# Patient Record
Sex: Female | Born: 2000 | Race: Black or African American | Hispanic: No | Marital: Single | Smoking: Never smoker
Health system: Southern US, Community
[De-identification: ages and names within clinical notes are randomized; demographics above are authoritative.]

## PROBLEM LIST (undated history)

## (undated) DIAGNOSIS — S8990XA Unspecified injury of unspecified lower leg, initial encounter: Secondary | ICD-10-CM

---
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
## 2020-05-23 ENCOUNTER — Emergency Department (HOSPITAL_COMMUNITY)
Admission: EM | Admit: 2020-05-23 | Discharge: 2020-05-24 | Disposition: A | Discharge status: Home / Self Care | Payer: 59 | Attending: Emergency Medicine | Admitting: Emergency Medicine

## 2020-05-23 ENCOUNTER — Emergency Department (HOSPITAL_COMMUNITY): Payer: 59

## 2020-05-23 ENCOUNTER — Other Ambulatory Visit: Payer: Self-pay

## 2020-05-23 ENCOUNTER — Encounter (HOSPITAL_COMMUNITY): Payer: Self-pay

## 2020-05-23 DIAGNOSIS — R1031 Right lower quadrant pain: Secondary | ICD-10-CM | POA: Insufficient documentation

## 2020-05-23 DIAGNOSIS — R102 Pelvic and perineal pain: Secondary | ICD-10-CM

## 2020-05-23 LAB — CBC
HCT: 35.2 % — ABNORMAL LOW (ref 36.0–46.0)
Hemoglobin: 11.1 g/dL — ABNORMAL LOW (ref 12.0–15.0)
MCH: 27.2 pg (ref 26.0–34.0)
MCHC: 31.5 g/dL (ref 30.0–36.0)
MCV: 86.3 fL (ref 80.0–100.0)
Platelets: 312 10*3/uL (ref 150–400)
RBC: 4.08 MIL/uL (ref 3.87–5.11)
RDW: 13.2 % (ref 11.5–15.5)
WBC: 6.5 10*3/uL (ref 4.0–10.5)
nRBC: 0 % (ref 0.0–0.2)

## 2020-05-23 LAB — COMPREHENSIVE METABOLIC PANEL
ALT: 10 U/L (ref 0–44)
AST: 14 U/L — ABNORMAL LOW (ref 15–41)
Albumin: 3.9 g/dL (ref 3.5–5.0)
Alkaline Phosphatase: 57 U/L (ref 38–126)
Anion gap: 8 (ref 5–15)
BUN: 10 mg/dL (ref 6–20)
CO2: 27 mmol/L (ref 22–32)
Calcium: 9.5 mg/dL (ref 8.9–10.3)
Chloride: 105 mmol/L (ref 98–111)
Creatinine, Ser: 0.78 mg/dL (ref 0.44–1.00)
GFR calc Af Amer: >60 mL/min (ref >60)
GFR calc non Af Amer: >60 mL/min (ref >60)
Glucose, Bld: 105 mg/dL — ABNORMAL HIGH (ref 70–99)
Potassium: 3.6 mmol/L (ref 3.5–5.1)
Sodium: 140 mmol/L (ref 135–145)
Total Bilirubin: 1.2 mg/dL (ref 0.3–1.2)
Total Protein: 7.5 g/dL (ref 6.5–8.1)

## 2020-05-23 LAB — URINALYSIS, ROUTINE W REFLEX MICROSCOPIC
Bilirubin Urine: NEGATIVE
Glucose, UA: NEGATIVE mg/dL
Ketones, ur: NEGATIVE mg/dL
Nitrite: NEGATIVE
Protein, ur: 30 mg/dL — AB
Specific Gravity, Urine: 1.028 (ref 1.005–1.030)
pH: 6 (ref 5.0–8.0)

## 2020-05-23 LAB — I-STAT BETA HCG BLOOD, ED (MC, WL, AP ONLY): I-stat hCG, quantitative: <5 m[IU]/mL (ref <5)

## 2020-05-23 LAB — LIPASE, BLOOD: Lipase: 25 U/L (ref 11–51)

## 2020-05-23 MED ORDER — ONDANSETRON HCL 4 MG/2ML IJ SOLN
4.0000 mg | Freq: Once | INTRAMUSCULAR | Status: AC
  Administered 2020-05-24: 4 mg via INTRAVENOUS
  Filled 2020-05-23: qty 2

## 2020-05-23 MED ORDER — KETOROLAC TROMETHAMINE 30 MG/ML IJ SOLN
30.0000 mg | Freq: Once | INTRAMUSCULAR | Status: AC
  Administered 2020-05-23: 30 mg via INTRAVENOUS
  Filled 2020-05-23: qty 1

## 2020-05-23 MED ORDER — ONDANSETRON HCL 4 MG/2ML IJ SOLN
4.0000 mg | Freq: Once | INTRAMUSCULAR | Status: AC
  Administered 2020-05-23: 4 mg via INTRAVENOUS
  Filled 2020-05-23: qty 2

## 2020-05-23 MED ORDER — HYDROMORPHONE HCL 1 MG/ML IJ SOLN
0.5000 mg | Freq: Once | INTRAMUSCULAR | Status: AC
  Administered 2020-05-23: 0.5 mg via INTRAVENOUS
  Filled 2020-05-23: qty 1

## 2020-05-23 MED ORDER — HYDROMORPHONE HCL 1 MG/ML IJ SOLN
1.0000 mg | Freq: Once | INTRAMUSCULAR | Status: AC
  Administered 2020-05-23: 1 mg via INTRAVENOUS
  Filled 2020-05-23: qty 1

## 2020-05-23 MED ORDER — SODIUM CHLORIDE 0.9 % IV BOLUS
1000.0000 mL | Freq: Once | INTRAVENOUS | Status: AC
  Administered 2020-05-23: 1000 mL via INTRAVENOUS

## 2020-05-23 MED ORDER — HYDROMORPHONE HCL 1 MG/ML IJ SOLN
1.0000 mg | Freq: Once | INTRAMUSCULAR | Status: AC
  Administered 2020-05-24: 1 mg via INTRAVENOUS
  Filled 2020-05-23: qty 1

## 2020-05-23 MED ORDER — SODIUM CHLORIDE 0.9% FLUSH: 3.0000 mL | Freq: Once | INTRAVENOUS | Status: DC

## 2020-05-23 MED ORDER — SODIUM CHLORIDE 0.9 % IV SOLN
1.0000 g | Freq: Once | INTRAVENOUS | Status: AC
  Administered 2020-05-24: 1 g via INTRAVENOUS
  Filled 2020-05-23: qty 10

## 2020-05-23 MED ORDER — SODIUM CHLORIDE 0.9 % IV SOLN: INTRAVENOUS | Status: DC

## 2020-05-23 MED ORDER — HYDROMORPHONE HCL 1 MG/ML IJ SOLN
2.0000 mg | Freq: Once | INTRAMUSCULAR | Status: AC
  Administered 2020-05-23: 2 mg via INTRAVENOUS
  Filled 2020-05-23: qty 2

## 2020-05-23 NOTE — ED Triage Notes (Signed)
Patient complains of right sided abdominal pain with radiation to back that started this am. Nausea, no emesis, no diarrhea. Pain worse with ambulation

## 2020-05-23 NOTE — Discharge Instructions (Addendum)
Extensive work-up for the right lower quadrant abdominal pain without any definitive findings.  Some question of perhaps a urinary tract infection but not definitive.  We will go ahead and treat with antibiotic.  Take the Keflex as directed for the next 7 days.  Take the Percocet as needed for pain.  Take the Zofran to control nausea and vomiting.  Make an appointment to follow-up with your GYN doctor.

## 2020-05-23 NOTE — ED Notes (Signed)
This RN asked the EDP to order pain medication if able for this pt; awaiting for further orders

## 2020-05-23 NOTE — ED Notes (Signed)
Pt transported to US

## 2020-05-23 NOTE — ED Provider Notes (Signed)
[facility] EMERGENCY DEPARTMENT Provider Note   CSN: [id number] Arrival date & time: 05/23/20  1309     History Chief Complaint  Patient presents with  . Flank Pain    Tricia Knox is a 19 y.o. female.  Patient with the acute onset of right lower quadrant abdominal pain is 11 this morning.  No nausea no vomiting.  She states the pain is severe and it was sudden onset.  There was no prodrome.  Patient did say maybe she thought she saw some blood in her urine.  No prior history of kidney stones no prior history of similar pain.  Patient denies any vaginal discharge.        Past Medical History:  Diagnosis Date  . Knee injury     There are no problems to display for this patient.   History reviewed. No pertinent surgical history.   OB History   No obstetric history on file.     No family history on file.  Social History   Tobacco Use  . Smoking status: Never Smoker  . Smokeless tobacco: Never Used  Vaping Use  . Vaping Use: Never used  Substance Use Topics  . Alcohol use: No  . Drug use: Not on file    Home Medications Prior to Admission medications   Medication Sig Start Date End Date Taking? Authorizing Provider  cephALEXin (KEFLEX) 500 MG capsule Take 1 capsule (500 mg total) by mouth 4 (four) times daily. 05/24/20   Vanetta Mulders, MD  ondansetron (ZOFRAN ODT) 4 MG disintegrating tablet Take 1 tablet (4 mg total) by mouth every 8 (eight) hours as needed for nausea or vomiting. 12/19/16   Bethel Born, PA-C  ondansetron (ZOFRAN ODT) 4 MG disintegrating tablet Take 1 tablet (4 mg total) by mouth every 8 (eight) hours as needed for nausea or vomiting. 05/23/20   Vanetta Mulders, MD  oxyCODONE-acetaminophen (PERCOCET/ROXICET) 5-325 MG tablet Take 1 tablet by mouth every 6 (six) hours as needed for severe pain. 05/24/20   Vanetta Mulders, MD    Allergies    Patient has no known allergies.  Review of Systems   Review of Systems    Constitutional: Negative for chills and fever.  HENT: Negative for congestion, rhinorrhea and sore throat.   Eyes: Negative for visual disturbance.  Respiratory: Negative for cough and shortness of breath.   Cardiovascular: Negative for chest pain and leg swelling.  Gastrointestinal: Positive for abdominal pain. Negative for diarrhea, nausea and vomiting.  Genitourinary: Positive for flank pain and hematuria. Negative for dysuria, vaginal bleeding and vaginal discharge.  Musculoskeletal: Negative for back pain and neck pain.  Skin: Negative for rash.  Neurological: Negative for dizziness, light-headedness and headaches.  Hematological: Does not bruise/bleed easily.  Psychiatric/Behavioral: Negative for confusion.    Physical Exam Updated Vital Signs BP 101/60   Pulse 68   Temp 98.4 F (36.9 C) (Oral)   Resp (!) 26   SpO2 93%   Physical Exam Vitals and nursing note reviewed.  Constitutional:      General: She is in acute distress.     Appearance: Normal appearance. She is well-developed.  HENT:     Head: Normocephalic and atraumatic.  Eyes:     Extraocular Movements: Extraocular movements intact.     Conjunctiva/sclera: Conjunctivae normal.     Pupils: Pupils are equal, round, and reactive to light.  Cardiovascular:     Rate and Rhythm: Normal rate and regular rhythm.  Heart sounds: No murmur heard.   Pulmonary:     Effort: Pulmonary effort is normal. No respiratory distress.     Breath sounds: Normal breath sounds.  Abdominal:     Palpations: Abdomen is soft.     Tenderness: There is no abdominal tenderness. There is no guarding.  Musculoskeletal:        General: No swelling. Normal range of motion.     Cervical back: Normal range of motion and neck supple.  Skin:    General: Skin is warm and dry.     Capillary Refill: Capillary refill takes less than 2 seconds.  Neurological:     General: No focal deficit present.     Mental Status: She is alert and oriented  to person, place, and time.     ED Results / Procedures / Treatments   Labs (all labs ordered are listed, but only abnormal results are displayed) Labs Reviewed  COMPREHENSIVE METABOLIC PANEL - Abnormal; Notable for the following components:      Result Value   Glucose, Bld 105 (*)    AST 14 (*)    All other components within normal limits  CBC - Abnormal; Notable for the following components:   Hemoglobin 11.1 (*)    HCT 35.2 (*)    All other components within normal limits  URINALYSIS, ROUTINE W REFLEX MICROSCOPIC - Abnormal; Notable for the following components:   APPearance HAZY (*)    Hgb urine dipstick MODERATE (*)    Protein, ur 30 (*)    Leukocytes,Ua TRACE (*)    Bacteria, UA RARE (*)    All other components within normal limits  URINE CULTURE  LIPASE, BLOOD  I-STAT BETA HCG BLOOD, ED ([facility], [facility], [facility] ONLY)    EKG None  Radiology CT Renal Stone Study  Result Date: 05/23/2020 CLINICAL DATA:  Right lower quadrant pain.  Flank pain. EXAM: CT ABDOMEN AND PELVIS WITHOUT CONTRAST TECHNIQUE: Multidetector CT imaging of the abdomen and pelvis was performed following the standard protocol without IV contrast. COMPARISON:  None. FINDINGS: Lower chest: No acute abnormality. Hepatobiliary: No focal liver abnormality is seen. No gallstones, gallbladder wall thickening, or biliary dilatation. Pancreas: Unremarkable. No pancreatic ductal dilatation or surrounding inflammatory changes. Spleen: Normal in size without focal abnormality. Adrenals/Urinary Tract: Adrenal glands are unremarkable. Kidneys are normal, without renal calculi, focal lesion, or hydronephrosis. Bladder is unremarkable. Stomach/Bowel: Stomach is within normal limits. Appendix appears normal. No evidence of bowel wall thickening, distention, or inflammatory changes. Vascular/Lymphatic: No significant vascular findings are present. No enlarged abdominal or pelvic lymph nodes. Reproductive: The uterus is normal. Possible  dominant follicle in the right ovary located just superior and anterior to the uterus as seen on axial image 69 and coronal image 28. Possible prominent ovary in the left adnexa as seen on coronal image 46 and axial image 70. Other: No abdominal wall hernia or abnormality. No abdominopelvic ascites. Musculoskeletal: No acute or significant osseous findings. IMPRESSION: 1. No renal or ureteral stones.  No renal obstruction. 2. Possible dominant follicle/cyst in the right ovary, incompletely evaluated on this study. Suggested prominence of the left ovary as well. Recommend pelvic ultrasound for further evaluation of the adnexa. Electronically Signed   By: Gerome Samavid  Williams III M.D   On: 05/23/2020 17:53   US PELVIC COMPLETE W TRANSVAGINAL AND TORSION R/O  Result Date: 05/23/2020 CLINICAL DATA:  Right-sided abdominal pain and pelvic pain x1 day. EXAM: TRANSABDOMINAL AND TRANSVAGINAL ULTRASOUND OF PELVIS DOPPLER ULTRASOUND OF OVARIES TECHNIQUE:  Both transabdominal and transvaginal ultrasound examinations of the pelvis were performed. Transabdominal technique was performed for global imaging of the pelvis including uterus, ovaries, adnexal regions, and pelvic cul-de-sac. It was necessary to proceed with endovaginal exam following the transabdominal exam to visualize the . Color and duplex Doppler ultrasound was utilized to evaluate blood flow to the ovaries. COMPARISON:  None. FINDINGS: Uterus Measurements: 8.9 cm x 4.5 cm x 4.7 cm = volume: 97.05 mL. No fibroids or other mass visualized. Endometrium Thickness: 2.2 mm. Diffusely increased vascularity is seen throughout the endometrium. Right ovary Measurements: 3.6 cm x 2.8 cm x 3.6 cm = volume: 19.06 mL. A 2.1 cm x 2.1 cm x 1.7 cm anechoic structure is seen within the right ovary. A 4.33 cm x 0.73 cm area of heterogeneous hypoechogenicity is seen superior to the uterus and lateral to the right ovary. Left ovary Measurements: 5.4 cm x 3.5 cm x 4.9 cm = volume: 47.05  mL. A complex appearing area of heterogeneous hypoechogenicity is seen within the left ovary (ultrasound a graphic measurements not provided). Increased flow is seen surrounding this region on color Doppler evaluation. Pulsed Doppler evaluation of both ovaries demonstrates normal low-resistance arterial and venous waveforms. Other findings A small amount of pelvic free fluid is noted. IMPRESSION: 1. Increased vascularity throughout the endometrium. Correlation with the patient's history is recommended to exclude recent vaginal delivery with subsequent retained products of conception. 2. Right ovarian cyst. 3. Enlarged left ovary which contains an area of complex hypoechogenicity. This area may represent a complex left ovarian cyst. Correlation with follow-up pelvic ultrasound is recommended to determine stability. Additional evaluation with MRI should be considered. Electronically Signed   By: Aram Candela M.D.   On: 05/23/2020 20:07    Procedures Procedures (including critical care time)  Medications Ordered in ED Medications  sodium chloride flush (NS) 0.9 % injection 3 mL (0 mLs Intravenous Hold 05/23/20 1642)  0.9 %  sodium chloride infusion ( Intravenous New Bag/Given 05/23/20 1707)  cefTRIAXone (ROCEPHIN) 1 g in sodium chloride 0.9 % 100 mL IVPB (has no administration in time range)  ondansetron (ZOFRAN) injection 4 mg (has no administration in time range)  HYDROmorphone (DILAUDID) injection 1 mg (has no administration in time range)  sodium chloride 0.9 % bolus 1,000 mL (0 mLs Intravenous Stopped 05/23/20 1821)  ondansetron (ZOFRAN) injection 4 mg (4 mg Intravenous Given 05/23/20 1707)  HYDROmorphone (DILAUDID) injection 0.5 mg (0.5 mg Intravenous Given 05/23/20 1707)  HYDROmorphone (DILAUDID) injection 0.5 mg (0.5 mg Intravenous Given 05/23/20 1747)  HYDROmorphone (DILAUDID) injection 0.5 mg (0.5 mg Intravenous Given 05/23/20 1847)  HYDROmorphone (DILAUDID) injection 1 mg (1 mg Intravenous  Given 05/23/20 1959)  ketorolac (TORADOL) 30 MG/ML injection 30 mg (30 mg Intravenous Given 05/23/20 2159)  HYDROmorphone (DILAUDID) injection 2 mg (2 mg Intravenous Given 05/23/20 2304)    ED Course  I have reviewed the triage vital signs and the nursing notes.  Pertinent labs & imaging results that were available during my care of the patient were reviewed by me and considered in my medical decision making (see chart for details).    MDM Rules/Calculators/A&P                          Patient with severe pain pain out of proportion for abdominal findings.  Initially thinking in terms of a kidney stone since it was sudden onset with no prodrome.  Also possibility of appendicitis but that was  unusual how quickly it started.  The other concern was for ovarian torsion.  CT scan ruled out any concerns for appendicitis or any signs of kidney stones.  Raise some concern for an ovarian cyst on the right and and also an ovarian cyst on the left.  So ultrasound was done to rule out torsion.  That was negative.  Did show evidence of a left heterogenic ovarian cyst not on the side with the pain that will require follow-up by GYN.  Urinalysis had some white blood cells negative nitrite not classic for urinary tract infection culture sent.  But since we struggled so hard to get her pain under control and is still not completely under control decided to going give a gram of Rocephin and send her out with Keflex as well as Percocet for pain and Zofran.  Mother will make an appointment to follow-up with GYN.  Since pain with the work-up being all negative may very well be female organ related.  No discharge.  Pain is all on the one side.  Pregnancy test negative.  Not consistent with PID.   Final Clinical Impression(s) / ED Diagnoses Final diagnoses:  Right lower quadrant abdominal pain    Rx / DC Orders ED Discharge Orders         Ordered    cephALEXin (KEFLEX) 500 MG capsule  4 times daily     Discontinue   Reprint     05/24/20 0002    oxyCODONE-acetaminophen (PERCOCET/ROXICET) 5-325 MG tablet  Every 6 hours PRN     Discontinue  Reprint     05/24/20 0002    ondansetron (ZOFRAN ODT) 4 MG disintegrating tablet  Every 8 hours PRN     Discontinue  Reprint     05/24/20 0002           Vanetta Mulders, MD 05/24/20 0012

## 2020-05-24 MED ORDER — OXYCODONE-ACETAMINOPHEN 5-325 MG PO TABS: 1.0000 | ORAL_TABLET | Freq: Four times a day (QID) | ORAL | 0 refills | Status: DC | PRN

## 2020-05-24 MED ORDER — CEPHALEXIN 500 MG PO CAPS: 500.0000 mg | ORAL_CAPSULE | Freq: Four times a day (QID) | ORAL | 0 refills | Status: DC

## 2020-05-24 MED ORDER — ONDANSETRON 4 MG PO TBDP: 4.0000 mg | ORAL_TABLET | Freq: Three times a day (TID) | ORAL | 1 refills | Status: DC | PRN

## 2020-05-24 NOTE — ED Notes (Signed)
Patient verbalizes understanding of discharge instructions. Opportunity for questioning and answers were provided. Medications are infusing and pt will be discharged completely upon completion.

## 2020-05-25 LAB — URINE CULTURE: Culture: <10000 — AB

## 2020-05-28 ENCOUNTER — Other Ambulatory Visit: Payer: Self-pay | Admitting: Obstetrics and Gynecology

## 2021-06-05 ENCOUNTER — Encounter (HOSPITAL_BASED_OUTPATIENT_CLINIC_OR_DEPARTMENT_OTHER): Payer: Self-pay | Admitting: Emergency Medicine

## 2021-06-05 ENCOUNTER — Other Ambulatory Visit: Payer: Self-pay

## 2021-06-05 ENCOUNTER — Emergency Department (HOSPITAL_BASED_OUTPATIENT_CLINIC_OR_DEPARTMENT_OTHER)
Admission: EM | Admit: 2021-06-05 | Discharge: 2021-06-06 | Disposition: A | Discharge status: Home / Self Care | Payer: 59 | Attending: Emergency Medicine | Admitting: Emergency Medicine

## 2021-06-05 DIAGNOSIS — S91201A Unspecified open wound of right great toe with damage to nail, initial encounter: Secondary | ICD-10-CM | POA: Diagnosis present

## 2021-06-05 DIAGNOSIS — X500XXA Overexertion from strenuous movement or load, initial encounter: Secondary | ICD-10-CM | POA: Diagnosis not present

## 2021-06-05 DIAGNOSIS — S91209A Unspecified open wound of unspecified toe(s) with damage to nail, initial encounter: Secondary | ICD-10-CM

## 2021-06-05 MED ORDER — LIDOCAINE HCL 2 % IJ SOLN
10.0000 mL | Freq: Once | INTRAMUSCULAR | Status: AC
  Administered 2021-06-06: 200 mg
  Filled 2021-06-05: qty 20

## 2021-06-05 NOTE — ED Triage Notes (Signed)
Reports she hit her right great toe while lifting something into the truck causing the nail to partially rip away from the flesh.  Dried blood noted.

## 2021-06-05 NOTE — ED Provider Notes (Addendum)
[facility] Provider Note: Tricia Dell, MD, FACEP  CSN: [id number] MRN: [id number] ARRIVAL: 06/05/21 at 2340 ROOM: [facility]   CHIEF COMPLAINT  Toe Injury   HISTORY OF PRESENT ILLNESS  06/05/21 11:53 PM Tricia Knox is a 20 y.o. female who partially avulsed her right great toenail while lifting something onto a truck.  She rates her associated pain as a 10 out of 10, throbbing in nature, worse with movement or weightbearing.  This occurred several hours ago.   Past Medical History:  Diagnosis Date   Knee injury     History reviewed. No pertinent surgical history.  No family history on file.  Social History   Tobacco Use   Smoking status: Never   Smokeless tobacco: Never  Vaping Use   Vaping Use: Never used  Substance Use Topics   Alcohol use: No    Prior to Admission medications   Medication Sig Start Date End Date Taking? Authorizing Provider  cephALEXin (KEFLEX) 500 MG capsule Take 1 capsule (500 mg total) by mouth 4 (four) times daily. 05/24/20   Vanetta Mulders, MD  ondansetron (ZOFRAN ODT) 4 MG disintegrating tablet Take 1 tablet (4 mg total) by mouth every 8 (eight) hours as needed for nausea or vomiting. 12/19/16   Bethel Born, PA-C  ondansetron (ZOFRAN ODT) 4 MG disintegrating tablet Take 1 tablet (4 mg total) by mouth every 8 (eight) hours as needed for nausea or vomiting. 05/23/20   Vanetta Mulders, MD  oxyCODONE-acetaminophen (PERCOCET/ROXICET) 5-325 MG tablet Take 1 tablet by mouth every 6 (six) hours as needed for severe pain. 05/24/20   Vanetta Mulders, MD    Allergies Patient has no known allergies.   REVIEW OF SYSTEMS  Negative except as noted here or in the History of Present Illness.   PHYSICAL EXAMINATION  Initial Vital Signs Blood pressure 111/61, pulse 72, temperature 98.2 F (36.8 C), temperature source Oral, resp. rate 18, height 5\' 5"  (1.651 m), weight 57.6 kg, last menstrual period 03/17/2021, SpO2 100  %.  Examination General: Well-developed, well-nourished female in no acute distress; appearance consistent with age of record HENT: normocephalic; atraumatic Eyes: Normal appearance Neck: supple Heart: regular rate and rhythm Lungs: clear to auscultation bilaterally Abdomen: soft; nondistended; nontender; bowel sounds present Extremities: No bony deformity; partial avulsion of nail of right great toe:    Neurologic: Awake, alert and oriented; motor function intact in all extremities and symmetric; no facial droop Skin: Warm and dry Psychiatric: Normal mood and affect   RESULTS  Summary of this visit's results, reviewed and interpreted by myself:   EKG Interpretation  Date/Time:    Ventricular Rate:    PR Interval:    QRS Duration:   QT Interval:    QTC Calculation:   R Axis:     Text Interpretation:         Laboratory Studies: No results found for this or any previous visit (from the past 24 hour(s)). Imaging Studies: No results found.  ED COURSE and MDM  Nursing notes, initial and subsequent vitals signs, including pulse oximetry, reviewed and interpreted by myself.  Vitals:   06/05/21 2348 06/05/21 2348  BP:  111/61  Pulse:  72  Resp:  18  Temp:  98.2 F (36.8 C)  TempSrc:  Oral  SpO2:  100%  Weight: 57.6 kg   Height: 5\' 5"  (1.651 m)    Medications  cephALEXin (KEFLEX) capsule 500 mg (has no administration in time range)  lidocaine (XYLOCAINE) 2 % (  with pres) injection 200 mg (200 mg Other Given by Other 06/06/21 0003)   Toenail replaced and anchored into nail fold as described below.  Will start antibiotics for infection prophylaxis.  Patient advised not to get that toe wet as water can easily seep under the nail and cause an infection.  We will refer to podiatry for follow-up.   PROCEDURES  .Nail Removal  Date/Time: 06/06/2021 12:25 AM Performed by: Azlaan Isidore, MD Authorized by: Tinzley Dalia, MD   Consent:    Consent obtained:  Verbal    Consent given by:  Patient   Risks, benefits, and alternatives were discussed: yes     Risks discussed:  Bleeding, pain and permanent nail deformity Universal protocol:    Procedure explained and questions answered to patient or proxy's satisfaction: yes     Required blood products, implants, devices, and special equipment available: yes     Patient identity confirmed:  Verbally with patient and arm band Location:    Foot:  R big toe Pre-procedure details:    Skin preparation:  Povidone-iodine   Preparation: Patient was prepped and draped in the usual sterile fashion   Anesthesia:    Anesthesia method:  Nerve block   Block location:  Base of right great toe   Block needle gauge:  27 G   Block anesthetic:  Lidocaine 2% w/o epi   Block technique:  Digital block   Block injection procedure:  Anatomic landmarks identified, introduced needle, incremental injection, anatomic landmarks palpated and negative aspiration for blood   Block outcome:  Anesthesia achieved Nail Removal:    Nail removed:  Partial   Nail bed repaired: no     Removed nail replaced and anchored: yes     Stented with:  Natural nail Trephination:    Subungual hematoma drained: no   Ingrown nail:    Wedge excision of skin: no   Post-procedure details:    Dressing:  Xeroform gauze   Procedure completion:  Tolerated well, no immediate complications    ED DIAGNOSES     ICD-10-CM   1. Nail avulsion of toe, initial encounter  Z61.096E          Paula Libra, MD 06/06/21 0029    Paula Libra, MD 06/06/21 [phone]

## 2021-06-06 MED ORDER — HYDROCODONE-ACETAMINOPHEN 5-325 MG PO TABS: 2.0000 | ORAL_TABLET | ORAL | 0 refills | Status: DC | PRN

## 2021-06-06 MED ORDER — CEPHALEXIN 500 MG PO CAPS: 500.0000 mg | ORAL_CAPSULE | Freq: Four times a day (QID) | ORAL | 0 refills | Status: AC

## 2021-06-06 MED ORDER — LIDOCAINE HCL (PF) 1 % IJ SOLN
INTRAMUSCULAR | Status: AC
  Filled 2021-06-06: qty 5

## 2021-06-06 MED ORDER — FLUCONAZOLE 150 MG PO TABS: ORAL_TABLET | ORAL | 0 refills | Status: DC

## 2021-06-06 MED ORDER — CEPHALEXIN 250 MG PO CAPS
500.0000 mg | ORAL_CAPSULE | Freq: Once | ORAL | Status: AC
  Administered 2021-06-06: 500 mg via ORAL
  Filled 2021-06-06: qty 2

## 2021-06-06 MED ORDER — CEPHALEXIN 500 MG PO CAPS: 500.0000 mg | ORAL_CAPSULE | Freq: Four times a day (QID) | ORAL | 0 refills | Status: DC

## 2021-06-06 NOTE — ED Notes (Signed)
ED Provider at bedside. 

## 2021-06-08 ENCOUNTER — Encounter: Payer: Self-pay | Admitting: Podiatry

## 2021-06-08 ENCOUNTER — Ambulatory Visit (INDEPENDENT_AMBULATORY_CARE_PROVIDER_SITE_OTHER): Payer: 59 | Admitting: Podiatry

## 2021-06-08 ENCOUNTER — Other Ambulatory Visit: Payer: Self-pay

## 2021-06-08 DIAGNOSIS — S90211A Contusion of right great toe with damage to nail, initial encounter: Secondary | ICD-10-CM

## 2021-06-08 MED ORDER — NEOMYCIN-POLYMYXIN-HC 3.5-10000-1 OT SOLN: OTIC | 0 refills | Status: DC

## 2021-06-09 NOTE — Progress Notes (Signed)
  Subjective:  Patient ID: Tricia Knox, female    DOB: [birth year],  MRN: [id number] HPI Chief Complaint  Patient presents with   Toe Injury    Hallux right - DOI 06/05/21 - got toenail caught and pulled almost off, went to UC - doc sutured back in place, doesn't hurt, keeping wrapped   New Patient (Initial Visit)    20 y.o. female presents with the above complaint.   ROS: Denies fever chills nausea vomit muscle aches pains calf pain back pain chest pain shortness of breath.  Past Medical History:  Diagnosis Date   Knee injury    History reviewed. No pertinent surgical history.  Current Outpatient Medications:    neomycin-polymyxin-hydrocortisone (CORTISPORIN) OTIC solution, Apply one to two drops to toe daily before dressing, Disp: 10 mL, Rfl: 0   cephALEXin (KEFLEX) 500 MG capsule, Take 1 capsule (500 mg total) by mouth 4 (four) times daily for 5 days., Disp: 20 capsule, Rfl: 0   fluconazole (DIFLUCAN) 150 MG tablet, Take 1 tablet as needed for vaginal yeast infection.  May repeat in 3 days if symptoms persist., Disp: 2 tablet, Rfl: 0   HYDROcodone-acetaminophen (NORCO) 5-325 MG tablet, Take 2 tablets by mouth every 4 (four) hours as needed for severe pain., Disp: 6 tablet, Rfl: 0  No Known Allergies Review of Systems Objective:  There were no vitals filed for this visit.  General: Well developed, nourished, in no acute distress, alert and oriented x3   Dermatological: Skin is warm, dry and supple bilateral. Nails x 10 are well maintained; remaining integument appears unremarkable at this time. There are no open sores, no preulcerative lesions, no rash or signs of infection present.  There is no erythema edema cellulitis drainage or odor to the hallux left though her nail is being held on with 2 Prolene sutures 1 medially and 1 laterally.  There is no purulence no malodor that will see any signs of infection at this point.  Proximal portion of the nail is in appropriate  position.  Vascular: Dorsalis Pedis artery and Posterior Tibial artery pedal pulses are 2/4 bilateral with immedate capillary fill time. Pedal hair growth present. No varicosities and no lower extremity edema present bilateral.   Neruologic: Grossly intact via light touch bilateral. Vibratory intact via tuning fork bilateral. Protective threshold with Semmes Wienstein monofilament intact to all pedal sites bilateral. Patellar and Achilles deep tendon reflexes 2+ bilateral. No Babinski or clonus noted bilateral.   Musculoskeletal: No gross boney pedal deformities bilateral. No pain, crepitus, or limitation noted with foot and ankle range of motion bilateral. Muscular strength 5/5 in all groups tested bilateral.  Gait: Unassisted, Nonantalgic.    Radiographs:  None taken  Assessment & Plan:   Assessment: Near complete nail avulsion  Plan: Wrote a prescription for Corticosporin otic drops to be applied to the nail.  She will not soak this but I will allow her to take shower and I will follow-up with her in 2 weeks for suture removal.  If the nail stays on great if it does not or is loose we will remove it.     Latrell Potempa T. Barton, [place]

## 2021-06-22 ENCOUNTER — Ambulatory Visit: Payer: 59 | Admitting: Podiatry

## 2021-06-24 ENCOUNTER — Encounter: Payer: Self-pay | Admitting: Podiatry

## 2021-06-24 ENCOUNTER — Ambulatory Visit (INDEPENDENT_AMBULATORY_CARE_PROVIDER_SITE_OTHER): Payer: 59 | Admitting: Podiatry

## 2021-06-24 ENCOUNTER — Other Ambulatory Visit: Payer: Self-pay

## 2021-06-24 DIAGNOSIS — S90211D Contusion of right great toe with damage to nail, subsequent encounter: Secondary | ICD-10-CM | POA: Diagnosis not present

## 2021-06-25 NOTE — Progress Notes (Signed)
She presents today for follow-up of her nail avulsion from her visit to the emergency department where they sewed the toenail back onto the toe itself.  She would like for [place] to remove the sutures.  Objective: Vital signs are stable she is alert and oriented x3.  Pulses are palpable.  There is no erythema edema/drainage or odor sutures removed today margins very well coapted I recommended that she continue to keep the toenail tied down for least another week or so utilizing a Band-Aid.  I expressed to her that she needs to be careful not to pull the Band-Aid off lifting the toenail off.  She understands and is amenable to it.  Assessment: Well-healing nail avulsion repaired by the emergency department.  Plan: I will follow-up with her on an as-needed basis.

## 2021-11-25 ENCOUNTER — Encounter (HOSPITAL_BASED_OUTPATIENT_CLINIC_OR_DEPARTMENT_OTHER): Payer: Self-pay

## 2021-11-25 ENCOUNTER — Emergency Department (HOSPITAL_BASED_OUTPATIENT_CLINIC_OR_DEPARTMENT_OTHER)
Admission: EM | Admit: 2021-11-25 | Discharge: 2021-11-26 | Disposition: A | Discharge status: Home / Self Care | Payer: 59 | Attending: Emergency Medicine | Admitting: Emergency Medicine

## 2021-11-25 ENCOUNTER — Other Ambulatory Visit: Payer: Self-pay

## 2021-11-25 DIAGNOSIS — I889 Nonspecific lymphadenitis, unspecified: Secondary | ICD-10-CM | POA: Diagnosis not present

## 2021-11-25 DIAGNOSIS — U071 COVID-19: Secondary | ICD-10-CM | POA: Insufficient documentation

## 2021-11-25 DIAGNOSIS — J029 Acute pharyngitis, unspecified: Secondary | ICD-10-CM | POA: Diagnosis present

## 2021-11-25 DIAGNOSIS — J020 Streptococcal pharyngitis: Secondary | ICD-10-CM

## 2021-11-25 LAB — RESP PANEL BY RT-PCR (FLU A&B, COVID) ARPGX2
Influenza A by PCR: NEGATIVE
Influenza B by PCR: NEGATIVE
SARS Coronavirus 2 by RT PCR: POSITIVE — AB

## 2021-11-25 LAB — GROUP A STREP BY PCR: Group A Strep by PCR: DETECTED — AB

## 2021-11-25 NOTE — ED Triage Notes (Signed)
Patient here POV from Home with Cyst and Sore Throat.  Patient states Cyst is Located on Right Chest/Torso and has been Sore and Present for approximately 2 days.  Patient also endorses Sore Throat with No Fever/Congestion for approximately 12 hours.  NAD noted during Triage. A&Ox4. GCS 15. Ambulatory.

## 2021-11-26 MED ORDER — AMOXICILLIN-POT CLAVULANATE 875-125 MG PO TABS: 1.0000 | ORAL_TABLET | Freq: Two times a day (BID) | ORAL | 0 refills | Status: AC

## 2021-11-26 MED ORDER — NAPROXEN 500 MG PO TABS: 500.0000 mg | ORAL_TABLET | Freq: Two times a day (BID) | ORAL | 0 refills | Status: DC

## 2021-11-26 MED ORDER — AMOXICILLIN-POT CLAVULANATE 875-125 MG PO TABS
1.0000 | ORAL_TABLET | Freq: Once | ORAL | Status: AC
  Administered 2021-11-26: 1 via ORAL
  Filled 2021-11-26: qty 1

## 2021-11-26 NOTE — ED Provider Notes (Signed)
[facility]ERGENCY [facility] Emergency Department Provider Note MRN:  [id number]  Arrival date & time: 11/26/21     Chief Complaint   Cyst and Sore Throat   History of Present Illness   Tricia Knox is a 21 y.o. year-old female with no pertinent past medical history presenting to the ED with chief complaint of cyst and sore throat.  Multiple complaints this evening.  Nodule or cyst in the armpit/right lateral breast for the past 2 or 3 days.  Painful, becoming red, tender to palpation.  Also experiencing sore throat that is worse with swallowing.  Denies nasal congestion, no fever, no chest pain or shortness of breath, no abdominal pain.  Review of Systems  A thorough review of systems was obtained and all systems are negative except as noted in the HPI and PMH.   Patient's Health History    Past Medical History:  Diagnosis Date   Knee injury     History reviewed. No pertinent surgical history.  No family history on file.  Social History   Socioeconomic History   Marital status: Single    Spouse name: Not on file   Number of children: Not on file   Years of education: Not on file   Highest education level: Not on file  Occupational History   Not on file  Tobacco Use   Smoking status: Never   Smokeless tobacco: Never  Vaping Use   Vaping Use: Never used  Substance and Sexual Activity   Alcohol use: No   Drug use: Never   Sexual activity: Not on file  Other Topics Concern   Not on file  Social History Narrative   Not on file   Social Determinants of Health   Financial Resource Strain: Not on file  Food Insecurity: Not on file  Transportation Needs: Not on file  Physical Activity: Not on file  Stress: Not on file  Social Connections: Not on file  Intimate Partner Violence: Not on file     Physical Exam   Vitals:   11/25/21 2209  BP: 121/77  Pulse: 80  Resp: 16  Temp: 98.2 F (36.8 C)  SpO2: 100%    CONSTITUTIONAL: Well-appearing,  NAD NEURO/PSYCH:  Alert and oriented x 3, no focal deficits EYES:  eyes equal and reactive ENT/NECK:  no LAD, no JVD CARDIO: Regular rate, well-perfused, normal S1 and S2 PULM:  CTAB no wheezing or rhonchi GI/GU:  non-distended, non-tender MSK/SPINE:  No gross deformities, no edema SKIN: Erythema surrounding 2 or 3 cm nodule within the right lateral breast at the 10 o'clock position   *Additional and/or pertinent findings included in MDM below  Diagnostic and Interventional Summary    EKG Interpretation  Date/Time:    Ventricular Rate:    PR Interval:    QRS Duration:   QT Interval:    QTC Calculation:   R Axis:     Text Interpretation:         Labs Reviewed  GROUP A STREP BY PCR - Abnormal; Notable for the following components:      Result Value   Group A Strep by PCR DETECTED (*)    All other components within normal limits  RESP PANEL BY RT-PCR (FLU A&B, COVID) ARPGX2 - Abnormal; Notable for the following components:   SARS Coronavirus 2 by RT PCR POSITIVE (*)    All other components within normal limits    No orders to display    Medications  amoxicillin-clavulanate (AUGMENTIN) 875-125 MG per  tablet 1 tablet (has no administration in time range)     Procedures  /  Critical Care Ultrasound ED Soft Tissue  Date/Time: 11/26/2021 12:58 AM Performed by: Sabas Sous, MD Authorized by: Sabas Sous, MD   Procedure details:    Indications: localization of abscess and evaluate for cellulitis     Transverse view:  Visualized   Longitudinal view:  Visualized   Images: archived   Location:    Location: breast     Side:  Right Findings:     cellulitis present Comments:     Cobblestoning consistent with cellulitis, there is a circular hypoechoic structure with internal blood flow on color Doppler, seems consistent with lymph node.  ED Course and Medical Decision Making  Initial Impression and Ddx Sore throat likely viral, also considering strep throat,  there is no significant asymmetry or any other signs of abscess or more significant contiguous infection.  The findings of the right breast are suspicious for either abscess or adenitis, less likely neoplasm.  Will obtain bedside ultrasound.  Past medical/surgical history that increases complexity of ED encounter: None  Interpretation of Diagnostics See ultrasound details above, suspicious for adenitis.  Patient has tested positive for COVID as well as strep throat  Patient Reassessment and Ultimate Disposition/Management Appropriate for discharge, isolation instructions for COVID, Augmentin to cover for the strep throat as well as the likely adenitis.  Patient is instructed to have follow-up regarding this nodule, definitely to be reevaluated if not improving.  Abscess seems less likely but still possible.  Patient management required discussion with the following services or consulting groups:  None  Complexity of Problems Addressed Acute complicated illness or Injury  Additional Data Reviewed and Analyzed Further history obtained from: Further history from spouse/family member  Patient Encounter Risk Assessment Moderate:  Prescriptions  Elmer Sow. Pilar Plate, MD [facility] Emergency Medicine [facility] [email or web address]  Final Clinical Impressions(s) / ED Diagnoses     ICD-10-CM   1. COVID-19  U07.1     2. Adenitis  I88.9     3. Strep throat  J02.0       ED Discharge Orders          Ordered    amoxicillin-clavulanate (AUGMENTIN) 875-125 MG tablet  Every 12 hours        11/26/21 0054    naproxen (NAPROSYN) 500 MG tablet  2 times daily        11/26/21 0055             Discharge Instructions Discussed with and Provided to Patient:    Discharge Instructions      You were evaluated in the Emergency Department and after careful evaluation, we did not find any emergent condition requiring admission or further testing in the hospital.  Your  exam/testing today was overall reassuring.  You tested positive for COVID-19 as well as strep throat.  We also think you have an infected lymph node.  Please take the Augmentin antibiotic as directed.  Recommend repeat evaluation if not improving within the next 3 days.  Use the Naprosyn anti-inflammatory for pain.  Please return to the Emergency Department if you experience any worsening of your condition.  Thank you for allowing [place] to be a part of your care.       Sabas Sous, MD 11/26/21 0100

## 2021-11-26 NOTE — Discharge Instructions (Signed)
You were evaluated in the Emergency Department and after careful evaluation, we did not find any emergent condition requiring admission or further testing in the hospital.  Your exam/testing today was overall reassuring.  You tested positive for COVID-19 as well as strep throat.  We also think you have an infected lymph node.  Please take the Augmentin antibiotic as directed.  Recommend repeat evaluation if not improving within the next 3 days.  Use the Naprosyn anti-inflammatory for pain.  Please return to the Emergency Department if you experience any worsening of your condition.  Thank you for allowing [place] to be a part of your care.

## 2022-04-19 ENCOUNTER — Encounter (HOSPITAL_BASED_OUTPATIENT_CLINIC_OR_DEPARTMENT_OTHER): Payer: Self-pay

## 2022-04-19 ENCOUNTER — Emergency Department (HOSPITAL_BASED_OUTPATIENT_CLINIC_OR_DEPARTMENT_OTHER)
Admission: EM | Admit: 2022-04-19 | Discharge: 2022-04-19 | Disposition: A | Discharge status: Home / Self Care | Payer: 59 | Attending: Emergency Medicine | Admitting: Emergency Medicine

## 2022-04-19 ENCOUNTER — Emergency Department (HOSPITAL_BASED_OUTPATIENT_CLINIC_OR_DEPARTMENT_OTHER): Payer: 59

## 2022-04-19 ENCOUNTER — Other Ambulatory Visit: Payer: Self-pay

## 2022-04-19 DIAGNOSIS — M79604 Pain in right leg: Secondary | ICD-10-CM | POA: Insufficient documentation

## 2022-04-19 DIAGNOSIS — M545 Low back pain, unspecified: Secondary | ICD-10-CM | POA: Insufficient documentation

## 2022-04-19 DIAGNOSIS — Y92410 Unspecified street and highway as the place of occurrence of the external cause: Secondary | ICD-10-CM | POA: Insufficient documentation

## 2022-04-19 MED ORDER — METHOCARBAMOL 500 MG PO TABS: 500.0000 mg | ORAL_TABLET | Freq: Three times a day (TID) | ORAL | 0 refills | Status: DC | PRN

## 2022-04-19 MED ORDER — METHOCARBAMOL 500 MG PO TABS
500.0000 mg | ORAL_TABLET | Freq: Once | ORAL | Status: AC
  Administered 2022-04-19: 500 mg via ORAL
  Filled 2022-04-19: qty 1

## 2022-04-19 MED ORDER — IBUPROFEN 800 MG PO TABS
800.0000 mg | ORAL_TABLET | Freq: Once | ORAL | Status: AC
  Administered 2022-04-19: 800 mg via ORAL
  Filled 2022-04-19: qty 1

## 2022-04-19 MED ORDER — IBUPROFEN 800 MG PO TABS: 800.0000 mg | ORAL_TABLET | Freq: Three times a day (TID) | ORAL | 0 refills | Status: DC | PRN
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
## 2022-04-19 NOTE — Discharge Instructions (Signed)

## 2022-04-23 ENCOUNTER — Encounter (HOSPITAL_BASED_OUTPATIENT_CLINIC_OR_DEPARTMENT_OTHER): Payer: Self-pay

## 2022-04-23 ENCOUNTER — Emergency Department (HOSPITAL_BASED_OUTPATIENT_CLINIC_OR_DEPARTMENT_OTHER)
Admission: EM | Admit: 2022-04-23 | Discharge: 2022-04-23 | Disposition: A | Discharge status: Home / Self Care | Payer: Self-pay | Attending: Emergency Medicine | Admitting: Emergency Medicine

## 2022-04-23 ENCOUNTER — Other Ambulatory Visit: Payer: Self-pay

## 2022-04-23 DIAGNOSIS — Z202 Contact with and (suspected) exposure to infections with a predominantly sexual mode of transmission: Secondary | ICD-10-CM | POA: Insufficient documentation

## 2022-04-23 LAB — URINALYSIS, ROUTINE W REFLEX MICROSCOPIC
Bilirubin Urine: NEGATIVE
Glucose, UA: NEGATIVE mg/dL
Hgb urine dipstick: NEGATIVE
Ketones, ur: NEGATIVE mg/dL
Nitrite: NEGATIVE
Protein, ur: NEGATIVE mg/dL
Specific Gravity, Urine: 1.015 (ref 1.005–1.030)
pH: 6 (ref 5.0–8.0)

## 2022-04-23 LAB — URINALYSIS, MICROSCOPIC (REFLEX): RBC / HPF: NONE SEEN RBC/hpf (ref 0–5)

## 2022-04-23 LAB — WET PREP, GENITAL
Clue Cells Wet Prep HPF POC: NONE SEEN
Sperm: NONE SEEN
Trich, Wet Prep: NONE SEEN
WBC, Wet Prep HPF POC: >=10 — AB (ref <10)
Yeast Wet Prep HPF POC: NONE SEEN

## 2022-04-23 LAB — PREGNANCY, URINE: Preg Test, Ur: NEGATIVE

## 2022-04-23 MED ORDER — CEFTRIAXONE SODIUM 500 MG IJ SOLR
500.0000 mg | Freq: Once | INTRAMUSCULAR | Status: AC
  Administered 2022-04-23: 500 mg via INTRAMUSCULAR
  Filled 2022-04-23: qty 500

## 2022-04-23 MED ORDER — DOXYCYCLINE HYCLATE 100 MG PO CAPS: 100.0000 mg | ORAL_CAPSULE | Freq: Two times a day (BID) | ORAL | 0 refills | Status: AC

## 2022-04-23 NOTE — Discharge Instructions (Signed)
You were treated here for both gonorrhea and chlamydia.  You had 1 injection in your arm and then you will take the second antibiotic twice daily for 7 days.  Please complete the entire course of antibiotics.  Your results should return in days, however regardless of what they say, you have already been treated.  Please note that antibiotics do put you at higher risk for developing other side effects such as diarrhea or yeast infections.  Follow-up with your PCP as needed.

## 2022-04-23 NOTE — ED Provider Notes (Signed)
[facility] EMERGENCY DEPARTMENT Provider Note   CSN: [id number] Arrival date & time: 04/23/22  1504     History  Chief Complaint  Patient presents with   Exposure to STD    Tricia Knox is a 21 y.o. female who presents to the emergency department for STD testing.  Patient states that she is unsure if she had exposure to STD but is having large amounts of discharge and thinks that she may have a yeast infection.  No vaginal pain or itching.  She states that she was recently on antibiotic and symptoms started shortly after that.  She denies new partners.  Denies difficulty urinating, hematuria, abdominal pain.   Exposure to STD       Home Medications Prior to Admission medications   Medication Sig Start Date End Date Taking? Authorizing Provider  doxycycline (VIBRAMYCIN) 100 MG capsule Take 1 capsule (100 mg total) by mouth 2 (two) times daily for 7 days. 04/23/22 04/30/22 Yes Raynald Blend R, PA-C  fluconazole (DIFLUCAN) 150 MG tablet Take 1 tablet as needed for vaginal yeast infection.  May repeat in 3 days if symptoms persist. 06/06/21   Molpus, Jonny Ruiz, MD  HYDROcodone-acetaminophen (NORCO) 5-325 MG tablet Take 2 tablets by mouth every 4 (four) hours as needed for severe pain. 06/06/21   Molpus, John, MD  ibuprofen (ADVIL) 800 MG tablet Take 1 tablet (800 mg total) by mouth every 8 (eight) hours as needed. 04/19/22   Long, Arlyss Repress, MD  methocarbamol (ROBAXIN) 500 MG tablet Take 1 tablet (500 mg total) by mouth every 8 (eight) hours as needed for muscle spasms. 04/19/22   Long, Arlyss Repress, MD  naproxen (NAPROSYN) 500 MG tablet Take 1 tablet (500 mg total) by mouth 2 (two) times daily. 11/26/21   Sabas Sous, MD  neomycin-polymyxin-hydrocortisone (CORTISPORIN) OTIC solution Apply one to two drops to toe daily before dressing 06/08/21   Ernestene Kiel T, [place]      Allergies    Patient has no known allergies.    Review of Systems   Review of Systems  Physical Exam Updated Vital  Signs BP 113/77   Pulse (!) 102   Temp 98.3 F (36.8 C) (Oral)   Resp 16   Ht 5\' 4"  (1.626 m)   Wt 60 kg   LMP  (LMP Unknown) Comment: Irregular on BCP  SpO2 100%   BMI 22.71 kg/m  Physical Exam Vitals and nursing note reviewed.  Constitutional:      General: She is not in acute distress.    Appearance: She is not ill-appearing.  HENT:     Head: Atraumatic.  Eyes:     Conjunctiva/sclera: Conjunctivae normal.  Cardiovascular:     Rate and Rhythm: Normal rate and regular rhythm.     Pulses: Normal pulses.     Heart sounds: No murmur heard. Pulmonary:     Effort: Pulmonary effort is normal. No respiratory distress.     Breath sounds: Normal breath sounds.  Abdominal:     General: Abdomen is flat. There is no distension.     Palpations: Abdomen is soft.     Tenderness: There is no abdominal tenderness.  Musculoskeletal:        General: Normal range of motion.     Cervical back: Normal range of motion.  Skin:    General: Skin is warm and dry.     Capillary Refill: Capillary refill takes less than 2 seconds.  Neurological:     General: No  focal deficit present.     Mental Status: She is alert.  Psychiatric:        Mood and Affect: Mood normal.     ED Results / Procedures / Treatments   Labs (all labs ordered are listed, but only abnormal results are displayed) Labs Reviewed  WET PREP, GENITAL - Abnormal; Notable for the following components:      Result Value   WBC, Wet Prep HPF POC >=10 (*)    All other components within normal limits  PREGNANCY, URINE  URINALYSIS, ROUTINE W REFLEX MICROSCOPIC  GC/CHLAMYDIA PROBE AMP ([place]) NOT AT [facility]    EKG None  Radiology No results found.  Procedures Procedures    Medications Ordered in ED Medications  cefTRIAXone (ROCEPHIN) injection 500 mg (has no administration in time range)    ED Course/ Medical Decision Making/ A&P                           Medical Decision Making Amount and/or Complexity of  Data Reviewed Labs: ordered.  Risk Prescription drug management.   21 year old female presents to the ED requesting STD check.  Differentials include UTI, STD, yeast infection or BV.  Vitals without significant abnormality.  She was mildly tachycardic at triage at 102 which was normal during my exam.  Physical exam overall benign.  Patient perform self swab wet prep.  Urine GC and chlamydia was also obtained and pending results.  Wet prep negative for yeast, BV and trichomonas although large amount of white blood cells noted.  Discussed with patient option of treating empirically if she truly suspects that she has STD versus waiting for results.  I advised her that there may be side effects including yeast infection or diarrhea if she takes antibiotics.  Patient wishes to be treated regardless.  500 mg Rocephin IM administered and discharged home with doxycycline twice daily x7 days.  Return precautions discussed.  Patient was discharged home in good condition. Final Clinical Impression(s) / ED Diagnoses Final diagnoses:  Possible exposure to STD    Rx / DC Orders ED Discharge Orders          Ordered    doxycycline (VIBRAMYCIN) 100 MG capsule  2 times daily        04/23/22 1652              Janell Quiet, [place] 04/23/22 1658    Terald Sleeper, MD 04/23/22 1706

## 2022-04-23 NOTE — ED Notes (Signed)
Patient states that she could not void . Water was given will  continue to monitor.

## 2022-04-23 NOTE — ED Triage Notes (Signed)
Pt recently been treated for yeast infection and requesting to be checked for other STD's . No complaints

## 2022-04-23 NOTE — ED Notes (Signed)
Patient states that she was on an antibiotic and believes that she has a yeast infection.

## 2022-04-25 LAB — GC/CHLAMYDIA PROBE AMP (~~LOC~~) NOT AT ARMC
Chlamydia: POSITIVE — AB
Comment: NEGATIVE
Comment: NORMAL
Neisseria Gonorrhea: NEGATIVE

## 2023-03-25 ENCOUNTER — Other Ambulatory Visit: Payer: Self-pay

## 2023-03-25 ENCOUNTER — Emergency Department (HOSPITAL_COMMUNITY): Payer: 59

## 2023-03-25 ENCOUNTER — Emergency Department (HOSPITAL_COMMUNITY)
Admission: EM | Admit: 2023-03-25 | Discharge: 2023-03-25 | Disposition: A | Discharge status: Home / Self Care | Payer: 59 | Attending: Emergency Medicine | Admitting: Emergency Medicine

## 2023-03-25 ENCOUNTER — Encounter (HOSPITAL_COMMUNITY): Payer: Self-pay

## 2023-03-25 DIAGNOSIS — R1031 Right lower quadrant pain: Secondary | ICD-10-CM | POA: Diagnosis not present

## 2023-03-25 DIAGNOSIS — D649 Anemia, unspecified: Secondary | ICD-10-CM | POA: Insufficient documentation

## 2023-03-25 DIAGNOSIS — R1030 Lower abdominal pain, unspecified: Secondary | ICD-10-CM

## 2023-03-25 LAB — URINALYSIS, ROUTINE W REFLEX MICROSCOPIC
Bacteria, UA: NONE SEEN
Bilirubin Urine: NEGATIVE
Glucose, UA: NEGATIVE mg/dL
Hgb urine dipstick: NEGATIVE
Ketones, ur: NEGATIVE mg/dL
Leukocytes,Ua: NEGATIVE
Nitrite: NEGATIVE
Protein, ur: 100 mg/dL — AB
Specific Gravity, Urine: 1.014 (ref 1.005–1.030)
pH: 5 (ref 5.0–8.0)

## 2023-03-25 LAB — CBC WITH DIFFERENTIAL/PLATELET
Abs Immature Granulocytes: 0.01 10*3/uL (ref 0.00–0.07)
Basophils Absolute: 0 10*3/uL (ref 0.0–0.1)
Basophils Relative: 1 %
Eosinophils Absolute: 0 10*3/uL (ref 0.0–0.5)
Eosinophils Relative: 1 %
HCT: 35.1 % — ABNORMAL LOW (ref 36.0–46.0)
Hemoglobin: 11.5 g/dL — ABNORMAL LOW (ref 12.0–15.0)
Immature Granulocytes: 0 %
Lymphocytes Relative: 36 %
Lymphs Abs: 1.8 10*3/uL (ref 0.7–4.0)
MCH: 29 pg (ref 26.0–34.0)
MCHC: 32.8 g/dL (ref 30.0–36.0)
MCV: 88.4 fL (ref 80.0–100.0)
Monocytes Absolute: 0.3 10*3/uL (ref 0.1–1.0)
Monocytes Relative: 6 %
Neutro Abs: 2.9 10*3/uL (ref 1.7–7.7)
Neutrophils Relative %: 56 %
Platelets: 251 10*3/uL (ref 150–400)
RBC: 3.97 MIL/uL (ref 3.87–5.11)
RDW: 12.6 % (ref 11.5–15.5)
WBC: 5.1 10*3/uL (ref 4.0–10.5)
nRBC: 0 % (ref 0.0–0.2)

## 2023-03-25 LAB — COMPREHENSIVE METABOLIC PANEL
ALT: 12 U/L (ref 0–44)
AST: 14 U/L — ABNORMAL LOW (ref 15–41)
Albumin: 4.4 g/dL (ref 3.5–5.0)
Alkaline Phosphatase: 39 U/L (ref 38–126)
Anion gap: 11 (ref 5–15)
BUN: 11 mg/dL (ref 6–20)
CO2: 22 mmol/L (ref 22–32)
Calcium: 9 mg/dL (ref 8.9–10.3)
Chloride: 106 mmol/L (ref 98–111)
Creatinine, Ser: 0.77 mg/dL (ref 0.44–1.00)
GFR, Estimated: >60 mL/min (ref >60)
Glucose, Bld: 88 mg/dL (ref 70–99)
Potassium: 3.4 mmol/L — ABNORMAL LOW (ref 3.5–5.1)
Sodium: 139 mmol/L (ref 135–145)
Total Bilirubin: 0.4 mg/dL (ref 0.3–1.2)
Total Protein: 7.5 g/dL (ref 6.5–8.1)

## 2023-03-25 LAB — PREGNANCY, URINE: Preg Test, Ur: NEGATIVE

## 2023-03-25 LAB — LIPASE, BLOOD: Lipase: 33 U/L (ref 11–51)

## 2023-03-25 LAB — ETHANOL: Alcohol, Ethyl (B): 143 mg/dL — ABNORMAL HIGH (ref <10)

## 2023-03-25 MED ORDER — SODIUM CHLORIDE 0.9 % IV BOLUS
1000.0000 mL | Freq: Once | INTRAVENOUS | Status: AC
  Administered 2023-03-25: 1000 mL via INTRAVENOUS

## 2023-03-25 MED ORDER — HYDROMORPHONE HCL 1 MG/ML IJ SOLN
1.0000 mg | Freq: Once | INTRAMUSCULAR | Status: AC
  Administered 2023-03-25: 1 mg via INTRAVENOUS
  Filled 2023-03-25: qty 1

## 2023-03-25 MED ORDER — IOHEXOL 300 MG/ML  SOLN
100.0000 mL | Freq: Once | INTRAMUSCULAR | Status: AC | PRN
  Administered 2023-03-25: 100 mL via INTRAVENOUS

## 2023-03-25 MED ORDER — ONDANSETRON HCL 4 MG/2ML IJ SOLN
4.0000 mg | Freq: Once | INTRAMUSCULAR | Status: AC
  Administered 2023-03-25: 4 mg via INTRAVENOUS
  Filled 2023-03-25: qty 2

## 2023-03-25 NOTE — ED Provider Notes (Signed)
[place] EMERGENCY DEPARTMENT AT [facility] Provider Note   CSN: [id number] Arrival date & time: 03/25/23  0327     History  Chief Complaint  Patient presents with   Tricia Knox    Fe Essenburg is a 22 y.o. female.  HPI   Without significant medical history presented with complaints of Tricia Knox, states it woke her up out of her sleep, states is in her lower abdomen suprapubic and right lower quadrant, constant, does not radiate, no associated nausea vomiting, no urinary symptoms, no vaginal discharge vaginal bleeding, still passing gas having normal bowel movements.  No history of ovarian torsion or ectopic pregnancies, last menstrual cycle was last month, currently on birth control, no Tricia surgeries, denies excessive alcohol use or NSAID use no history of GI bleeds.  No associate fevers chills cough congestion general body aches.  Home Medications Prior to Admission medications   Medication Sig Start Date End Date Taking? Authorizing Provider  fluconazole (DIFLUCAN) 150 MG tablet Take 1 tablet as needed for vaginal yeast infection.  May repeat in 3 days if symptoms persist. 06/06/21   Molpus, Jonny Ruiz, MD  HYDROcodone-acetaminophen (NORCO) 5-325 MG tablet Take 2 tablets by mouth every 4 (four) hours as needed for severe Knox. 06/06/21   Molpus, John, MD  ibuprofen (ADVIL) 800 MG tablet Take 1 tablet (800 mg total) by mouth every 8 (eight) hours as needed. 04/19/22   Long, Arlyss Repress, MD  methocarbamol (ROBAXIN) 500 MG tablet Take 1 tablet (500 mg total) by mouth every 8 (eight) hours as needed for muscle spasms. 04/19/22   Long, Arlyss Repress, MD  naproxen (NAPROSYN) 500 MG tablet Take 1 tablet (500 mg total) by mouth 2 (two) times daily. 11/26/21   Sabas Sous, MD  neomycin-polymyxin-hydrocortisone (CORTISPORIN) OTIC solution Apply one to two drops to toe daily before dressing 06/08/21   Ernestene Kiel T, [place]      Allergies    Patient has no known allergies.    Review  of Systems   Review of Systems  Constitutional:  Negative for chills and fever.  Respiratory:  Negative for shortness of breath.   Cardiovascular:  Negative for chest Knox.  Gastrointestinal:  Positive for Tricia Knox. Negative for diarrhea, nausea and vomiting.  Neurological:  Negative for headaches.    Physical Exam Updated Vital Signs BP 104/67   Pulse 76   Temp 98.4 F (36.9 C)   Resp 18   Ht 5\' 4"  (1.626 m)   Wt 59 kg   LMP 03/03/2023 (Exact Date)   SpO2 100%   BMI 22.31 kg/m  Physical Exam Vitals and nursing note reviewed.  Constitutional:      General: She is not in acute distress.    Appearance: She is not ill-appearing.  HENT:     Head: Normocephalic and atraumatic.     Nose: No congestion.  Eyes:     Conjunctiva/sclera: Conjunctivae normal.  Cardiovascular:     Rate and Rhythm: Normal rate and regular rhythm.     Pulses: Normal pulses.     Heart sounds: No murmur heard.    No friction rub. No gallop.  Pulmonary:     Effort: No respiratory distress.     Breath sounds: No wheezing, rhonchi or rales.  Tricia:     Palpations: Abdomen is soft.     Tenderness: There is Tricia tenderness. There is no right CVA tenderness or left CVA tenderness.     Comments: Abdomen nondistended, soft, tenderness  in the suprapubic right lower quadrant, no guarding or rebound tender no peritoneal sign  Skin:    General: Skin is warm and dry.  Neurological:     Mental Status: She is alert.  Psychiatric:        Mood and Affect: Mood normal.     ED Results / Procedures / Treatments   Labs (all labs ordered are listed, but only abnormal results are displayed) Labs Reviewed  COMPREHENSIVE METABOLIC PANEL - Abnormal; Notable for the following components:      Result Value   Potassium 3.4 (*)    AST 14 (*)    All other components within normal limits  CBC WITH DIFFERENTIAL/PLATELET - Abnormal; Notable for the following components:   Hemoglobin 11.5 (*)    HCT 35.1  (*)    All other components within normal limits  URINALYSIS, ROUTINE W REFLEX MICROSCOPIC - Abnormal; Notable for the following components:   Protein, ur 100 (*)    All other components within normal limits  ETHANOL - Abnormal; Notable for the following components:   Alcohol, Ethyl (B) 143 (*)    All other components within normal limits  LIPASE, BLOOD  PREGNANCY, URINE    EKG None  Radiology CT ABDOMEN PELVIS W CONTRAST  Result Date: 03/25/2023 CLINICAL DATA:  Right lower quadrant Tricia Knox. EXAM: CT ABDOMEN AND PELVIS WITH CONTRAST TECHNIQUE: Multidetector CT imaging of the abdomen and pelvis was performed using the standard protocol following bolus administration of intravenous contrast. RADIATION DOSE REDUCTION: This exam was performed according to the departmental dose-optimization program which includes automated exposure control, adjustment of the mA and/or kV according to patient size and/or use of iterative reconstruction technique. CONTRAST:  OMNIPAQUE IOHEXOL 300 MG/ML  SOLN COMPARISON:  05/23/2020 FINDINGS: Lower chest: No acute abnormality. Hepatobiliary: No focal liver abnormality is seen. No gallstones, gallbladder wall thickening, or biliary dilatation. Pancreas: Unremarkable. No pancreatic ductal dilatation or surrounding inflammatory changes. Spleen: Normal in size without focal abnormality. Adrenals/Urinary Tract: Adrenal glands are unremarkable. Kidneys are normal, without renal calculi, focal lesion, or hydronephrosis. Bladder is unremarkable. Stomach/Bowel: Stomach appears within normal limits. The appendix is visualized and appears normal. No bowel wall thickening, inflammation or distension. Vascular/Lymphatic: Normal appearance of the Tricia aorta. Upper Tricia vascularity appears patent. No abdominopelvic adenopathy. Reproductive: Uterus appears unremarkable. Asymmetric enlargement the left ovary with possible underlying hydrosalpinx. Other: Small volume  of free fluid identified within the abdomen and pelvis. Musculoskeletal: No acute or significant osseous findings. IMPRESSION: 1. Asymmetric enlargement of the left ovary with possible underlying hydrosalpinx. Small volume of free fluid identified within the abdomen and pelvis. Findings are nonspecific and may be physiologic in a patient of this age. More definitive characterization with pelvic sonogram may be helpful. 2. The appendix is visualized and appears normal. Electronically Signed   By: Signa Kell M.D.   On: 03/25/2023 06:10    Procedures Procedures    Medications Ordered in ED Medications  sodium chloride 0.9 % bolus 1,000 mL (0 mLs Intravenous Stopped 03/25/23 0632)  HYDROmorphone (DILAUDID) injection 1 mg (1 mg Intravenous Given 03/25/23 0459)  ondansetron (ZOFRAN) injection 4 mg (4 mg Intravenous Given 03/25/23 0458)  iohexol (OMNIPAQUE) 300 MG/ML solution 100 mL (100 mLs Intravenous Contrast Given 03/25/23 0544)    ED Course/ Medical Decision Making/ A&P Clinical Course as of 03/25/23 0644  Sat Mar 25, 2023  5621 Suprapubic, RLQ tenderness. Enlarged ovary on CT. Ultrasound. If negative, dc home.  [CG]  Clinical Course User Index [CG] Al Decant, PA-C                             Medical Decision Making Amount and/or Complexity of Data Reviewed Labs: ordered. Radiology: ordered.  Risk Prescription drug management.   This patient presents to the ED for concern of Tricia Knox, this involves an extensive number of treatment options, and is a complaint that carries with it a high risk of complications and morbidity.  The differential diagnosis includes appendicitis, ectopic pregnancy, ovarian torsion, gastritis, UTI, pyelokidney stone    Additional history obtained:  Additional history obtained from N/A External records from outside source obtained and reviewed including recent ER notes   Co morbidities that complicate the patient  evaluation  N/A  Social Determinants of Health:  N/A    Lab Tests:  I Ordered, and personally interpreted labs.  The pertinent results include: CBC shows normocytic anemia hemoglobin 11.5, CMP reveals potassium 3.4, AST 14, ethanol 143, UA unremarkable, lipase 33, urine Preg is negative   Imaging Studies ordered:  I ordered imaging studies including CT AP I independently visualized and interpreted imaging which showed CT scan reveals asymmetric large left ovary possible underlying hydrosalpinx.  I agree with the radiologist interpretation   Cardiac Monitoring:  The patient was maintained on a cardiac monitor.  I personally viewed and interpreted the cardiac monitored which showed an underlying rhythm of: n/a   Medicines ordered and prescription drug management:  I ordered medication including fluids, Knox medication, antiemetics, Knox medication I have reviewed the patients home medicines and have made adjustments as needed  Critical Interventions:  N/a   Reevaluation:  Presents with acute onset of Tricia Knox, tender right lower quadrant, I am concerned for possible appendicitis, will obtain lab workup and proceed with CT imaging.  CT scan reveals enlarged right ovary, due to the sudden onset of Knox I am concerned for possible torsion, will send on for ultrasound for further evaluation  Reassessed the patient she is resting comfortably she is agreement with ultrasound this time.    Consultations Obtained:  N/a    Test Considered:  N/a    Rule out Suspicion for UTI [place] or kidney stones at this time UA is negative for signs of infection or hematuria.  I doubt ectopic pregnancy urine pregnancy negative.  I doubt PID doubt there is any vaginal discharge vaginal bleeding no pelvic Knox.  Suspicion for obstruction, volvulus, diverticulitis, appendicitis, AAA, dissection is low CT imaging is negative these findings.    Dispostion and problem list  Due  to shift change patient will be handed off to [facility]  Follow-up on ultrasound, if concerns for hydrosalpinx or torsion consult with OB/GYN for further evaluation            Final Clinical Impression(s) / ED Diagnoses Final diagnoses:  Lower Tricia Knox    Rx / DC Orders ED Discharge Orders     None         Carroll Sage, PA-C 03/25/23 1610    Gilda Crease, MD 03/27/23 [phone]

## 2023-03-25 NOTE — ED Provider Notes (Signed)
  Physical Exam  BP 107/68 (BP Location: Left Arm)   Pulse 69   Temp 98.2 F (36.8 C) (Oral)   Resp 18   Ht 5\' 4"  (1.626 m)   Wt 59 kg   LMP 03/03/2023 (Exact Date)   SpO2 99%   BMI 22.31 kg/m   Physical Exam  Procedures  Procedures  ED Course / MDM   Clinical Course as of 03/25/23 0911  Sat Mar 25, 2023  1610 Suprapubic, RLQ tenderness. Enlarged ovary on CT. Ultrasound. If negative, dc home.  [CG]    Clinical Course User Index [CG] Al Decant, PA-C   Medical Decision Making Amount and/or Complexity of Data Reviewed Labs: ordered. Radiology: ordered.  Risk Prescription drug management.   22 year old female signed out to me at shift change pending ultrasound results.  Please see previous provider note for further details of the event.  In short, this is a 22 year old female who presents to the ED for evaluation of sudden onset of suprapubic and right lower quadrant abdominal pain last night.  Denies any infectious type symptoms such as vaginal discharge, fevers.  No dysuria, flank pain.  Still having bowel movements, still passing gas.  Last visual cycle this month, currently on birth control.  Denies abdominal surgeries or excessive alcohol use or NSAID use.  CT scan showed asymmetric enlargement of left ovary compared to right.  Patient sent ultrasound for further evaluation.  Ultrasound imaging negative for ovarian torsion or further confirmation of hydrosalpinx.  Has no white count here.  The patient will be discharged home and advised to [facility] with [facility] which I will refer her to.  She was given return precautions and she voiced understanding.  She had all her questions answered to her satisfaction.  She is stable to discharge home at this time.          Al Decant, PA-C 03/25/23 0911    Wynetta Fines, MD 03/26/23 [phone]

## 2023-03-25 NOTE — Discharge Instructions (Addendum)
Return to the ED with any new or worsening signs or symptoms such as fevers, increased abdominal pain Please follow-up at the [facility].  Please call and make an appointment to be seen on Monday.

## 2023-03-25 NOTE — ED Triage Notes (Signed)
Pt c/o lower abdominal pain that started suddenly tonight just prior to arrival. States that she ate some ghost pepper wings yesterday but felt fine up until about 15 minutes ago. Pt endorses alcohol use tonight. Denies n/v, vaginal bleeding/discharge, dysuria. Pt tearful during triage. No meds PTA.

## 2023-05-30 DIAGNOSIS — N898 Other specified noninflammatory disorders of vagina: Secondary | ICD-10-CM | POA: Diagnosis not present

## 2023-07-19 DIAGNOSIS — J029 Acute pharyngitis, unspecified: Secondary | ICD-10-CM | POA: Diagnosis not present
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
## 2023-09-05 DIAGNOSIS — N926 Irregular menstruation, unspecified: Secondary | ICD-10-CM | POA: Diagnosis not present

## 2023-09-05 DIAGNOSIS — Z01419 Encounter for gynecological examination (general) (routine) without abnormal findings: Secondary | ICD-10-CM | POA: Diagnosis not present

## 2023-09-05 DIAGNOSIS — N760 Acute vaginitis: Secondary | ICD-10-CM | POA: Diagnosis not present

## 2023-09-05 DIAGNOSIS — Z309 Encounter for contraceptive management, unspecified: Secondary | ICD-10-CM | POA: Diagnosis not present

## 2023-09-05 DIAGNOSIS — Z118 Encounter for screening for other infectious and parasitic diseases: Secondary | ICD-10-CM | POA: Diagnosis not present

## 2023-09-08 DIAGNOSIS — Z3046 Encounter for surveillance of implantable subdermal contraceptive: Secondary | ICD-10-CM | POA: Diagnosis not present

## 2023-09-18 DIAGNOSIS — R1031 Right lower quadrant pain: Secondary | ICD-10-CM | POA: Diagnosis not present

## 2023-09-18 DIAGNOSIS — N83201 Unspecified ovarian cyst, right side: Secondary | ICD-10-CM | POA: Diagnosis not present

## 2023-09-20 DIAGNOSIS — R300 Dysuria: Secondary | ICD-10-CM | POA: Diagnosis not present

## 2024-02-25 ENCOUNTER — Emergency Department (HOSPITAL_COMMUNITY)

## 2024-02-25 ENCOUNTER — Other Ambulatory Visit: Payer: Self-pay

## 2024-02-25 ENCOUNTER — Emergency Department (HOSPITAL_COMMUNITY)
Admission: EM | Admit: 2024-02-25 | Discharge: 2024-02-25 | Disposition: A | Discharge status: Home / Self Care | Attending: Emergency Medicine | Admitting: Emergency Medicine

## 2024-02-25 DIAGNOSIS — R000 Tachycardia, unspecified: Secondary | ICD-10-CM | POA: Diagnosis not present

## 2024-02-25 DIAGNOSIS — R102 Pelvic and perineal pain: Secondary | ICD-10-CM | POA: Insufficient documentation

## 2024-02-25 DIAGNOSIS — R1030 Lower abdominal pain, unspecified: Secondary | ICD-10-CM | POA: Diagnosis not present

## 2024-02-25 LAB — URINALYSIS, W/ REFLEX TO CULTURE (INFECTION SUSPECTED)
Bilirubin Urine: NEGATIVE
Glucose, UA: NEGATIVE mg/dL
Hgb urine dipstick: NEGATIVE
Ketones, ur: NEGATIVE mg/dL
Leukocytes,Ua: NEGATIVE
Nitrite: NEGATIVE
Protein, ur: NEGATIVE mg/dL
Specific Gravity, Urine: 1.005 (ref 1.005–1.030)
pH: 6 (ref 5.0–8.0)

## 2024-02-25 LAB — COMPREHENSIVE METABOLIC PANEL WITH GFR
ALT: 15 U/L (ref 0–44)
AST: 16 U/L (ref 15–41)
Albumin: 4.3 g/dL (ref 3.5–5.0)
Alkaline Phosphatase: 36 U/L — ABNORMAL LOW (ref 38–126)
Anion gap: 9 (ref 5–15)
BUN: 9 mg/dL (ref 6–20)
CO2: 23 mmol/L (ref 22–32)
Calcium: 9.3 mg/dL (ref 8.9–10.3)
Chloride: 110 mmol/L (ref 98–111)
Creatinine, Ser: 0.74 mg/dL (ref 0.44–1.00)
GFR, Estimated: >60 mL/min (ref >60)
Glucose, Bld: 85 mg/dL (ref 70–99)
Potassium: 3.5 mmol/L (ref 3.5–5.1)
Sodium: 142 mmol/L (ref 135–145)
Total Bilirubin: 0.5 mg/dL (ref 0.0–1.2)
Total Protein: 7.8 g/dL (ref 6.5–8.1)

## 2024-02-25 LAB — CBC WITH DIFFERENTIAL/PLATELET
Abs Immature Granulocytes: 0.01 10*3/uL (ref 0.00–0.07)
Basophils Absolute: 0 10*3/uL (ref 0.0–0.1)
Basophils Relative: 0 %
Eosinophils Absolute: 0 10*3/uL (ref 0.0–0.5)
Eosinophils Relative: 0 %
HCT: 40.7 % (ref 36.0–46.0)
Hemoglobin: 13.5 g/dL (ref 12.0–15.0)
Immature Granulocytes: 0 %
Lymphocytes Relative: 44 %
Lymphs Abs: 2.3 10*3/uL (ref 0.7–4.0)
MCH: 30.4 pg (ref 26.0–34.0)
MCHC: 33.2 g/dL (ref 30.0–36.0)
MCV: 91.7 fL (ref 80.0–100.0)
Monocytes Absolute: 0.4 10*3/uL (ref 0.1–1.0)
Monocytes Relative: 7 %
Neutro Abs: 2.6 10*3/uL (ref 1.7–7.7)
Neutrophils Relative %: 49 %
Platelets: 252 10*3/uL (ref 150–400)
RBC: 4.44 MIL/uL (ref 3.87–5.11)
RDW: 11.9 % (ref 11.5–15.5)
WBC: 5.3 10*3/uL (ref 4.0–10.5)
nRBC: 0 % (ref 0.0–0.2)

## 2024-02-25 LAB — HCG, SERUM, QUALITATIVE: Preg, Serum: NEGATIVE

## 2024-02-25 LAB — LIPASE, BLOOD: Lipase: 36 U/L (ref 11–51)

## 2024-02-25 MED ORDER — ONDANSETRON HCL 4 MG/2ML IJ SOLN
4.0000 mg | Freq: Once | INTRAMUSCULAR | Status: AC
  Administered 2024-02-25: 4 mg via INTRAVENOUS
  Filled 2024-02-25: qty 2

## 2024-02-25 MED ORDER — KETOROLAC TROMETHAMINE 30 MG/ML IJ SOLN
30.0000 mg | Freq: Once | INTRAMUSCULAR | Status: AC
  Administered 2024-02-25: 30 mg via INTRAVENOUS
  Filled 2024-02-25: qty 1

## 2024-02-25 MED ORDER — HYDROMORPHONE HCL 1 MG/ML IJ SOLN
1.0000 mg | Freq: Once | INTRAMUSCULAR | Status: AC
  Administered 2024-02-25: 1 mg via INTRAVENOUS
  Filled 2024-02-25: qty 1
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
## 2024-02-25 NOTE — ED Notes (Signed)
 Ultrasound at bedside

## 2024-02-25 NOTE — Discharge Instructions (Signed)

## 2024-02-25 NOTE — ED Triage Notes (Addendum)
 Pt reports severe left sided pelvic pain approx 30 min. 10/10 pain described as sharp. Sts she had a ovarian cyst in the past rupture - this pain feels similar.

## 2024-03-12 ENCOUNTER — Emergency Department (HOSPITAL_COMMUNITY)
Admission: EM | Admit: 2024-03-12 | Discharge: 2024-03-13 | Disposition: A | Discharge status: Home / Self Care | Attending: Emergency Medicine | Admitting: Emergency Medicine

## 2024-03-12 DIAGNOSIS — N3000 Acute cystitis without hematuria: Secondary | ICD-10-CM | POA: Diagnosis not present

## 2024-03-12 DIAGNOSIS — R109 Unspecified abdominal pain: Secondary | ICD-10-CM | POA: Diagnosis present

## 2024-03-13 ENCOUNTER — Encounter (HOSPITAL_COMMUNITY): Payer: Self-pay

## 2024-03-13 ENCOUNTER — Other Ambulatory Visit: Payer: Self-pay

## 2024-03-13 ENCOUNTER — Emergency Department (HOSPITAL_COMMUNITY)

## 2024-03-13 LAB — URINALYSIS, ROUTINE W REFLEX MICROSCOPIC
Bilirubin Urine: NEGATIVE
Glucose, UA: NEGATIVE mg/dL
Hgb urine dipstick: NEGATIVE
Ketones, ur: NEGATIVE mg/dL
Nitrite: POSITIVE — AB
Protein, ur: NEGATIVE mg/dL
Specific Gravity, Urine: 1.016 (ref 1.005–1.030)
pH: 6 (ref 5.0–8.0)

## 2024-03-13 LAB — COMPREHENSIVE METABOLIC PANEL WITH GFR
ALT: 11 U/L (ref 0–44)
AST: 16 U/L (ref 15–41)
Albumin: 3.9 g/dL (ref 3.5–5.0)
Alkaline Phosphatase: 34 U/L — ABNORMAL LOW (ref 38–126)
Anion gap: 10 (ref 5–15)
BUN: 10 mg/dL (ref 6–20)
CO2: 23 mmol/L (ref 22–32)
Calcium: 9.4 mg/dL (ref 8.9–10.3)
Chloride: 104 mmol/L (ref 98–111)
Creatinine, Ser: 0.42 mg/dL — ABNORMAL LOW (ref 0.44–1.00)
GFR, Estimated: >60 mL/min (ref >60)
Glucose, Bld: 103 mg/dL — ABNORMAL HIGH (ref 70–99)
Potassium: 3.6 mmol/L (ref 3.5–5.1)
Sodium: 137 mmol/L (ref 135–145)
Total Bilirubin: 0.4 mg/dL (ref 0.0–1.2)
Total Protein: 7.3 g/dL (ref 6.5–8.1)

## 2024-03-13 LAB — CBC WITH DIFFERENTIAL/PLATELET
Abs Immature Granulocytes: 0 10*3/uL (ref 0.00–0.07)
Basophils Absolute: 0 10*3/uL (ref 0.0–0.1)
Basophils Relative: 1 %
Eosinophils Absolute: 0 10*3/uL (ref 0.0–0.5)
Eosinophils Relative: 1 %
HCT: 36.8 % (ref 36.0–46.0)
Hemoglobin: 12.5 g/dL (ref 12.0–15.0)
Immature Granulocytes: 0 %
Lymphocytes Relative: 33 %
Lymphs Abs: 2 10*3/uL (ref 0.7–4.0)
MCH: 30.9 pg (ref 26.0–34.0)
MCHC: 34 g/dL (ref 30.0–36.0)
MCV: 91.1 fL (ref 80.0–100.0)
Monocytes Absolute: 0.5 10*3/uL (ref 0.1–1.0)
Monocytes Relative: 9 %
Neutro Abs: 3.4 10*3/uL (ref 1.7–7.7)
Neutrophils Relative %: 56 %
Platelets: 285 10*3/uL (ref 150–400)
RBC: 4.04 MIL/uL (ref 3.87–5.11)
RDW: 11.9 % (ref 11.5–15.5)
WBC: 6 10*3/uL (ref 4.0–10.5)
nRBC: 0 % (ref 0.0–0.2)

## 2024-03-13 LAB — LIPASE, BLOOD: Lipase: 39 U/L (ref 11–51)

## 2024-03-13 LAB — HCG, SERUM, QUALITATIVE: Preg, Serum: NEGATIVE

## 2024-03-13 MED ORDER — ONDANSETRON HCL 4 MG/2ML IJ SOLN
4.0000 mg | Freq: Once | INTRAMUSCULAR | Status: AC
  Administered 2024-03-13: 4 mg via INTRAVENOUS
  Filled 2024-03-13: qty 2

## 2024-03-13 MED ORDER — SODIUM CHLORIDE (PF) 0.9 % IJ SOLN
INTRAMUSCULAR | Status: AC
  Filled 2024-03-13: qty 50

## 2024-03-13 MED ORDER — MORPHINE SULFATE (PF) 4 MG/ML IV SOLN
4.0000 mg | Freq: Once | INTRAVENOUS | Status: AC
  Administered 2024-03-13: 4 mg via INTRAVENOUS
  Filled 2024-03-13: qty 1

## 2024-03-13 MED ORDER — CEFADROXIL 500 MG PO CAPS: 500.0000 mg | ORAL_CAPSULE | Freq: Two times a day (BID) | ORAL | 0 refills | Status: DC

## 2024-03-13 MED ORDER — ONDANSETRON 4 MG PO TBDP: 4.0000 mg | ORAL_TABLET | Freq: Three times a day (TID) | ORAL | 0 refills | Status: DC | PRN

## 2024-03-13 MED ORDER — SODIUM CHLORIDE 0.9 % IV SOLN
1.0000 g | Freq: Once | INTRAVENOUS | Status: AC
  Administered 2024-03-13: 1 g via INTRAVENOUS
  Filled 2024-03-13: qty 10

## 2024-03-13 MED ORDER — IOHEXOL 300 MG/ML  SOLN
100.0000 mL | Freq: Once | INTRAMUSCULAR | Status: AC | PRN
Start: 2024-03-13 — End: 2024-03-13
  Administered 2024-03-13: 100 mL via INTRAVENOUS

## 2024-03-13 NOTE — Discharge Instructions (Signed)
 As we discussed, it looks like you have a urinary tract infection.  This is likely causing your symptoms.  We have given you 1 round of IV antibiotics in the emergency department today and discharged to home with a course of oral antibiotics for you to take as prescribed in its entirety for management of your symptoms. Follow-up closely with your pcp.   Return if development of any new or worsening symptoms.

## 2024-03-13 NOTE — ED Triage Notes (Signed)
 Patient c/o "I have abdominal cyst and its really just bothering me a lot right now" patient c/o abdominal pain that started around 2000.

## 2024-03-13 NOTE — ED Provider Notes (Signed)
 [place] EMERGENCY DEPARTMENT AT [facility] Provider Note   CSN: [id number] Arrival date & time: 03/12/24  2354     History  Chief Complaint  Patient presents with   Abdominal Pain    Tricia Knox is a 23 y.o. female.  Patient with no pertinent past medical history presents today with complaints of abdominal pain. She states that same began earlier today and has been persistent since then. Pain is located in the LLQ and does not radiate. Does endorse some nausea but denies vomiting or diarrhea. She is having regular bowel movements and passing flatus. States that she has not had a menstrual cycle in a few months but this is normal for her. Denies vaginal discharge. No urinary symptoms. States she has been seen previously for similar pain and has not had a cause found for why this is happening. No fevers or chills.   The history is provided by the patient. No language interpreter was used.  Abdominal Pain      Home Medications Prior to Admission medications   Medication Sig Start Date End Date Taking? Authorizing Provider  fluconazole  (DIFLUCAN ) 150 MG tablet Take 1 tablet as needed for vaginal yeast infection.  May repeat in 3 days if symptoms persist. Patient not taking: Reported on 03/25/2023 06/06/21   Molpus, Autry Legions, MD  HYDROcodone -acetaminophen  (NORCO) 5-325 MG tablet Take 2 tablets by mouth every 4 (four) hours as needed for severe pain. Patient not taking: Reported on 03/25/2023 06/06/21   Molpus, Autry Legions, MD  ibuprofen  (ADVIL ) 800 MG tablet Take 1 tablet (800 mg total) by mouth every 8 (eight) hours as needed. Patient not taking: Reported on 03/25/2023 04/19/22   Long, Shereen Dike, MD  methocarbamol  (ROBAXIN ) 500 MG tablet Take 1 tablet (500 mg total) by mouth every 8 (eight) hours as needed for muscle spasms. Patient not taking: Reported on 03/25/2023 04/19/22   Long, Shereen Dike, MD  naproxen  (NAPROSYN ) 500 MG tablet Take 1 tablet (500 mg total) by mouth 2 (two) times  daily. Patient not taking: Reported on 03/25/2023 11/26/21   Edson Graces, MD  neomycin -polymyxin-hydrocortisone (CORTISPORIN) OTIC solution Apply one to two drops to toe daily before dressing Patient not taking: Reported on 03/25/2023 06/08/21   Clemetine Cypher, DPM      Allergies    Patient has no known allergies.    Review of Systems   Review of Systems  Gastrointestinal:  Positive for abdominal pain.  All other systems reviewed and are negative.   Physical Exam Updated Vital Signs BP 119/78 (BP Location: Left Arm)   Pulse 80   Temp 98.3 F (36.8 C) (Oral)   Resp 20   Ht 5\' 4"  (1.626 m)   Wt 63.5 kg   SpO2 100%   BMI 24.03 kg/m  Physical Exam Vitals and nursing note reviewed.  Constitutional:      General: She is not in acute distress.    Appearance: Normal appearance. She is normal weight. She is not ill-appearing, toxic-appearing or diaphoretic.  HENT:     Head: Normocephalic and atraumatic.  Cardiovascular:     Rate and Rhythm: Normal rate.  Pulmonary:     Effort: Pulmonary effort is normal. No respiratory distress.  Abdominal:     General: Abdomen is flat.     Palpations: Abdomen is soft.     Tenderness: There is abdominal tenderness in the left lower quadrant. There is no guarding or rebound.  Musculoskeletal:  General: Normal range of motion.     Cervical back: Normal range of motion.  Skin:    General: Skin is warm and dry.  Neurological:     General: No focal deficit present.     Mental Status: She is alert.  Psychiatric:        Mood and Affect: Mood normal.        Behavior: Behavior normal.    ED Results / Procedures / Treatments   Labs (all labs ordered are listed, but only abnormal results are displayed) Labs Reviewed  COMPREHENSIVE METABOLIC PANEL WITH GFR - Abnormal; Notable for the following components:      Result Value   Glucose, Bld 103 (*)    Creatinine, Ser 0.42 (*)    Alkaline Phosphatase 34 (*)    All other components within  normal limits  URINALYSIS, ROUTINE W REFLEX MICROSCOPIC - Abnormal; Notable for the following components:   APPearance HAZY (*)    Nitrite POSITIVE (*)    Leukocytes,Ua MODERATE (*)    Bacteria, UA MANY (*)    All other components within normal limits  LIPASE, BLOOD  CBC WITH DIFFERENTIAL/PLATELET  HCG, SERUM, QUALITATIVE    EKG None  Radiology CT ABDOMEN PELVIS W CONTRAST Result Date: 03/13/2024 CLINICAL DATA:  Acute nonlocalized abdominal pain EXAM: CT ABDOMEN AND PELVIS WITH CONTRAST TECHNIQUE: Multidetector CT imaging of the abdomen and pelvis was performed using the standard protocol following bolus administration of intravenous contrast. RADIATION DOSE REDUCTION: This exam was performed according to the departmental dose-optimization program which includes automated exposure control, adjustment of the mA and/or kV according to patient size and/or use of iterative reconstruction technique. CONTRAST:  100mL OMNIPAQUE  IOHEXOL  300 MG/ML  SOLN COMPARISON:  None Available. FINDINGS: Lower chest: No acute abnormality. Hepatobiliary: Mild hepatic steatosis. No enhancing intrahepatic mass. No intra or extrahepatic biliary ductal dilation. Gallbladder unremarkable. Pancreas: Unremarkable Spleen: Unremarkable Adrenals/Urinary Tract: Adrenal glands are unremarkable. Kidneys are normal, without renal calculi, focal lesion, or hydronephrosis. Bladder is unremarkable. Stomach/Bowel: Stomach is within normal limits. Appendix appears normal. No evidence of bowel wall thickening, distention, or inflammatory changes. Vascular/Lymphatic: No significant vascular findings are present. No enlarged abdominal or pelvic lymph nodes. Reproductive: Uterus and bilateral adnexa are unremarkable. Other: No abdominal wall hernia or abnormality. No abdominopelvic ascites. Musculoskeletal: No acute or significant osseous findings. IMPRESSION: 1. No acute intra-abdominal findings. 2. Mild hepatic steatosis. Electronically  Signed   By: Worthy Heads M.D.   On: 03/13/2024 03:20    Procedures Procedures    Medications Ordered in ED Medications  morphine (PF) 4 MG/ML injection 4 mg (has no administration in time range)  ondansetron  (ZOFRAN ) injection 4 mg (has no administration in time range)    ED Course/ Medical Decision Making/ A&P                                 Medical Decision Making Amount and/or Complexity of Data Reviewed Labs: ordered. Radiology: ordered.  Risk Prescription drug management.   This patient is a 23 y.o. female who presents to the ED for concern of abdominal pain, this involves an extensive number of treatment options, and is a complaint that carries with it a high risk of complications and morbidity. The emergent differential diagnosis prior to evaluation includes, but is not limited to,  PID, appendicitis, kidney stone, ruptured ovarian cyst, ovarian torsion, tubo-ovarian abscess, fibroids, endometriosis, diverticulitis, cystitis, ectopic pregnancy, dysmenorrhea, septic  abortion   This is not an exhaustive differential.   Past Medical History / Co-morbidities / Social History:  has a past medical history of Knee injury.  Additional history: Chart reviewed. Pertinent results include: has been seen here before for similar complaints, last time was 4/13, had normal pelvic US  at that time, discharged home  Physical Exam: Physical exam performed. The pertinent findings include: LLQ TTP.  Lab Tests: I ordered, and personally interpreted labs.  The pertinent results include:  no leukocytosis,  UA with + nitrite, moderate leukocytes, 11-20 WBCs, many bacteria, consistent with UTI   Imaging Studies: I ordered imaging studies including CT abdomen pelvis. I independently visualized and interpreted imaging which showed   1. No acute intra-abdominal findings. 2. Mild hepatic steatosis.  I agree with the radiologist interpretation.  Medications: I ordered medication  including morphine, zofran , Rocephin   for pain, nausea, UTI. Reevaluation of the patient after these medicines showed that the patient improved. I have reviewed the patients home medicines and have made adjustments as needed.  Disposition: After consideration of the diagnostic results and the patients response to treatment, I feel that emergency department workup does not suggest an emergent condition requiring admission or immediate intervention beyond what has been performed at this time. The plan is: discharge with duracef for UTI, close outpatient follow-up and return precautions.  Upon reassessment after above interventions, patient is feeling better and ready to go home.  Her nausea has improved and she is able to eat and drink without any vomiting.  Symptoms likely due to UTI, informed patient of same.  Expressed understanding.  She has no STI concerns at this time.  She is not septic.  No symptoms to suggest pyelonephritis.  Evaluation and diagnostic testing in the emergency department does not suggest an emergent condition requiring admission or immediate intervention beyond what has been performed at this time.  Plan for discharge with close PCP follow-up.  Patient is understanding and amenable with plan, educated on red flag symptoms that would prompt immediate return.  Patient discharged in stable condition.  Final Clinical Impression(s) / ED Diagnoses Final diagnoses:  Acute cystitis without hematuria    Rx / DC Orders ED Discharge Orders          Ordered    cefadroxil (DURICEF) 500 MG capsule  2 times daily        03/13/24 0353    ondansetron  (ZOFRAN -ODT) 4 MG disintegrating tablet  Every 8 hours PRN        03/13/24 0356          An After Visit Summary was printed and given to the patient.     Jane Birkel A, PA-C 03/13/24 0356    Ballard Bongo, MD 03/13/24 [phone]

## 2024-08-23 ENCOUNTER — Encounter (HOSPITAL_COMMUNITY): Payer: Self-pay | Admitting: Emergency Medicine

## 2024-08-23 ENCOUNTER — Emergency Department (HOSPITAL_COMMUNITY)
Admission: EM | Admit: 2024-08-23 | Discharge: 2024-08-23 | Disposition: A | Discharge status: Home / Self Care | Attending: Emergency Medicine | Admitting: Emergency Medicine

## 2024-08-23 ENCOUNTER — Other Ambulatory Visit: Payer: Self-pay

## 2024-08-23 ENCOUNTER — Emergency Department (HOSPITAL_COMMUNITY)

## 2024-08-23 DIAGNOSIS — R1032 Left lower quadrant pain: Secondary | ICD-10-CM | POA: Insufficient documentation

## 2024-08-23 DIAGNOSIS — R109 Unspecified abdominal pain: Secondary | ICD-10-CM | POA: Diagnosis present

## 2024-08-23 DIAGNOSIS — R110 Nausea: Secondary | ICD-10-CM | POA: Diagnosis not present

## 2024-08-23 LAB — CBC
HCT: 35.7 % — ABNORMAL LOW (ref 36.0–46.0)
Hemoglobin: 11.9 g/dL — ABNORMAL LOW (ref 12.0–15.0)
MCH: 30.5 pg (ref 26.0–34.0)
MCHC: 33.3 g/dL (ref 30.0–36.0)
MCV: 91.5 fL (ref 80.0–100.0)
Platelets: 239 K/uL (ref 150–400)
RBC: 3.9 MIL/uL (ref 3.87–5.11)
RDW: 12 % (ref 11.5–15.5)
WBC: 4.6 K/uL (ref 4.0–10.5)
nRBC: 0 % (ref 0.0–0.2)

## 2024-08-23 LAB — COMPREHENSIVE METABOLIC PANEL WITH GFR
ALT: 16 U/L (ref 0–44)
AST: 23 U/L (ref 15–41)
Albumin: 4.5 g/dL (ref 3.5–5.0)
Alkaline Phosphatase: 51 U/L (ref 38–126)
Anion gap: 12 (ref 5–15)
BUN: 7 mg/dL (ref 6–20)
CO2: 22 mmol/L (ref 22–32)
Calcium: 9.3 mg/dL (ref 8.9–10.3)
Chloride: 107 mmol/L (ref 98–111)
Creatinine, Ser: 0.72 mg/dL (ref 0.44–1.00)
GFR, Estimated: >60 mL/min (ref >60)
Glucose, Bld: 96 mg/dL (ref 70–99)
Potassium: 3.6 mmol/L (ref 3.5–5.1)
Sodium: 141 mmol/L (ref 135–145)
Total Bilirubin: 0.3 mg/dL (ref 0.0–1.2)
Total Protein: 7.1 g/dL (ref 6.5–8.1)

## 2024-08-23 LAB — HCG, SERUM, QUALITATIVE: Preg, Serum: NEGATIVE

## 2024-08-23 LAB — LIPASE, BLOOD: Lipase: 40 U/L (ref 11–51)

## 2024-08-23 LAB — URINALYSIS, ROUTINE W REFLEX MICROSCOPIC
Bilirubin Urine: NEGATIVE
Glucose, UA: NEGATIVE mg/dL
Hgb urine dipstick: NEGATIVE
Ketones, ur: NEGATIVE mg/dL
Leukocytes,Ua: NEGATIVE
Nitrite: NEGATIVE
Protein, ur: NEGATIVE mg/dL
Specific Gravity, Urine: 1.011 (ref 1.005–1.030)
pH: 6 (ref 5.0–8.0)

## 2024-08-23 MED ORDER — SODIUM CHLORIDE (PF) 0.9 % IJ SOLN
INTRAMUSCULAR | Status: AC
  Filled 2024-08-23: qty 50

## 2024-08-23 MED ORDER — KETOROLAC TROMETHAMINE 15 MG/ML IJ SOLN
15.0000 mg | Freq: Once | INTRAMUSCULAR | Status: AC
  Administered 2024-08-23: 15 mg via INTRAVENOUS
  Filled 2024-08-23: qty 1

## 2024-08-23 MED ORDER — IOHEXOL 300 MG/ML  SOLN
100.0000 mL | Freq: Once | INTRAMUSCULAR | Status: AC | PRN
  Administered 2024-08-23: 100 mL via INTRAVENOUS

## 2024-08-23 NOTE — ED Provider Notes (Signed)
 [place] EMERGENCY DEPARTMENT AT [facility] Provider Note   CSN: [id number] Arrival date & time: 08/23/24  9580     Patient presents with: Abdominal Pain   Tricia Knox is a 23 y.o. female.  Patient with no relevant past medical history on file presents to the emergency room complaining of lower left-sided abdominal pain which began this morning.  She endorses nausea without vomiting, dysuria, chest pain, shortness of breath, fever, vaginal discharge.    Abdominal Pain      Prior to Admission medications   Medication Sig Start Date End Date Taking? Authorizing Provider  cefadroxil  (DURICEF) 500 MG capsule Take 1 capsule (500 mg total) by mouth 2 (two) times daily. 03/13/24   Smoot, Lauraine LABOR, PA-C  fluconazole  (DIFLUCAN ) 150 MG tablet Take 1 tablet as needed for vaginal yeast infection.  May repeat in 3 days if symptoms persist. Patient not taking: Reported on 03/25/2023 06/06/21   Molpus, Norleen, MD  HYDROcodone -acetaminophen  (NORCO) 5-325 MG tablet Take 2 tablets by mouth every 4 (four) hours as needed for severe pain. Patient not taking: Reported on 03/25/2023 06/06/21   Molpus, Norleen, MD  ibuprofen  (ADVIL ) 800 MG tablet Take 1 tablet (800 mg total) by mouth every 8 (eight) hours as needed. Patient not taking: Reported on 03/25/2023 04/19/22   Long, Fonda MATSU, MD  methocarbamol  (ROBAXIN ) 500 MG tablet Take 1 tablet (500 mg total) by mouth every 8 (eight) hours as needed for muscle spasms. Patient not taking: Reported on 03/25/2023 04/19/22   Long, Fonda MATSU, MD  naproxen  (NAPROSYN ) 500 MG tablet Take 1 tablet (500 mg total) by mouth 2 (two) times daily. Patient not taking: Reported on 03/25/2023 11/26/21   Theadore Ozell HERO, MD  neomycin -polymyxin-hydrocortisone (CORTISPORIN) OTIC solution Apply one to two drops to toe daily before dressing Patient not taking: Reported on 03/25/2023 06/08/21   Hyatt, Max T, DPM  ondansetron  (ZOFRAN -ODT) 4 MG disintegrating tablet Take 1 tablet (4 mg  total) by mouth every 8 (eight) hours as needed. 03/13/24   Smoot, Lauraine LABOR, PA-C    Allergies: Patient has no known allergies.    Review of Systems  Gastrointestinal:  Positive for abdominal pain.    Updated Vital Signs BP 113/78   Pulse 89   Temp 97.6 F (36.4 C)   Resp 16   SpO2 100%   Physical Exam Vitals and nursing note reviewed.  Constitutional:      General: She is not in acute distress.    Appearance: She is well-developed.  HENT:     Head: Normocephalic and atraumatic.  Eyes:     Conjunctiva/sclera: Conjunctivae normal.  Cardiovascular:     Rate and Rhythm: Normal rate and regular rhythm.     Heart sounds: No murmur heard. Pulmonary:     Effort: Pulmonary effort is normal. No respiratory distress.     Breath sounds: Normal breath sounds.  Abdominal:     Palpations: Abdomen is soft.     Tenderness: There is abdominal tenderness in the left lower quadrant.  Musculoskeletal:        General: No swelling.     Cervical back: Neck supple.  Skin:    General: Skin is warm and dry.     Capillary Refill: Capillary refill takes less than 2 seconds.  Neurological:     Mental Status: She is alert.  Psychiatric:        Mood and Affect: Mood normal.     (all labs ordered are listed, but  only abnormal results are displayed) Labs Reviewed  CBC - Abnormal; Notable for the following components:      Result Value   Hemoglobin 11.9 (*)    HCT 35.7 (*)    All other components within normal limits  URINALYSIS, ROUTINE W REFLEX MICROSCOPIC - Abnormal; Notable for the following components:   Color, Urine STRAW (*)    All other components within normal limits  LIPASE, BLOOD  COMPREHENSIVE METABOLIC PANEL WITH GFR  HCG, SERUM, QUALITATIVE    EKG: None  Radiology: CT ABDOMEN PELVIS W CONTRAST Result Date: 08/23/2024 EXAM: CT ABDOMEN AND PELVIS WITH CONTRAST 08/23/2024 06:24:09 AM TECHNIQUE: CT of the abdomen and pelvis was performed with the administration of 100 mL of  iohexol  (OMNIPAQUE ) 300 MG/ML solution. Multiplanar reformatted images are provided for review. Automated exposure control, iterative reconstruction, and/or weight-based adjustment of the mA/kV was utilized to reduce the radiation dose to as low as reasonably achievable. COMPARISON: 03/13/2024 CLINICAL HISTORY: LLQ abdominal pain. Lower quadrant abdominal pain started this morning, nausea, WBCs 4.6, GFR > 60, negative HCG 08/23/2024. 100 mL Omni 300. FINDINGS: LOWER CHEST: No acute abnormality. LIVER: The liver is unremarkable. GALLBLADDER AND BILE DUCTS: Gallbladder is unremarkable. No biliary ductal dilatation. SPLEEN: No acute abnormality. PANCREAS: No acute abnormality. ADRENAL GLANDS: No acute abnormality. KIDNEYS, URETERS AND BLADDER: No stones in the kidneys or ureters. No hydronephrosis. No perinephric or periureteral stranding. Urinary bladder is unremarkable. GI AND BOWEL: Stomach demonstrates no acute abnormality. There is no bowel obstruction. There is a moderate stool burden within the colon. The appendix is normal. PERITONEUM AND RETROPERITONEUM: Small volume free fluid within the pelvis, which is nonspecific and likely physiologic in a premenopausal female. No free air. VASCULATURE: Aorta is normal in caliber. LYMPH NODES: No lymphadenopathy. REPRODUCTIVE ORGANS: Normal physiologic appearance of the uterus and adnexal structures. Upper limits of normal endometrium measure 1.8 cm. Recommend correlation with phase of menstrual cycle. No adnexal mass. BONES AND SOFT TISSUES: No acute osseous abnormality. No focal soft tissue abnormality. IMPRESSION: 1. No acute findings in the abdomen or pelvis related to LLQ abdominal pain. 2. Upper limits of normal endometrial thickness (1.8 cm); correlate with phase of menstrual cycle. 3. There is a moderate colonic stool burden. Electronically signed by: Waddell Calk MD 08/23/2024 06:34 AM EDT RP Workstation: [id number]     Procedures   Medications Ordered in  the ED  ketorolac  (TORADOL ) 15 MG/ML injection 15 mg (has no administration in time range)  iohexol  (OMNIPAQUE ) 300 MG/ML solution 100 mL (100 mLs Intravenous Contrast Given 08/23/24 0611)                                    Medical Decision Making Amount and/or Complexity of Data Reviewed Labs: ordered. Radiology: ordered.  Risk Prescription drug management.   This patient presents to the ED for concern of abdominal pain, this involves an extensive number of treatment options, and is a complaint that carries with it a high risk of complications and morbidity.  The differential diagnosis includes diverticulitis, appendicitis, cholecystitis, gastritis, colitis, others   Co morbidities / Chronic conditions that complicate the patient evaluation  None   Additional history obtained:  Additional history obtained from EMR   Lab Tests:  I Ordered, and personally interpreted labs.  The pertinent results include: Grossly unremarkable CMP, CBC, UA.  Negative pregnancy test   Imaging Studies ordered:  I ordered imaging studies  including CT abdomen pelvis with contrast I independently visualized and interpreted imaging which showed no acute findings I agree with the radiologist interpretation    Problem List / ED Course / Critical interventions / Medication management   I ordered medication including toradol    Reevaluation of the patient after these medicines showed that the patient improved I have reviewed the patients home medicines and have made adjustments as needed   Social Determinants of Health:  Patient has no reported health insurance   Test / Admission - Considered:  Patient resting comfortably upon reassessment.  She has no acute findings on imaging.  Lab work is grossly unremarkable.  She is tolerating oral intake.  No indication for further emergent workup or admission.  Patient stable for outpatient follow-up as needed.      Final diagnoses:  Left  lower quadrant abdominal pain    ED Discharge Orders     None          Logan Ubaldo KATHEE DEVONNA 08/23/24 [id number]    Palumbo, April, MD 08/23/24 [phone]

## 2024-08-23 NOTE — ED Triage Notes (Signed)
 Patient c/o lower quadrant abdominal pain started this morning. Patient report nausea, denies vomiting. Patient denies dysuria.

## 2024-08-23 NOTE — Discharge Instructions (Signed)
 Your workup this morning was reassuring.  Please follow-up with a primary care provider as needed.  Return to the emergency department if you develop any life-threatening symptoms.

## 2024-09-24 ENCOUNTER — Emergency Department (HOSPITAL_COMMUNITY)

## 2024-09-24 ENCOUNTER — Other Ambulatory Visit: Payer: Self-pay

## 2024-09-24 ENCOUNTER — Encounter (HOSPITAL_COMMUNITY): Payer: Self-pay

## 2024-09-24 ENCOUNTER — Emergency Department (HOSPITAL_COMMUNITY)
Admission: EM | Admit: 2024-09-24 | Discharge: 2024-09-25 | Disposition: A | Discharge status: Home / Self Care | Attending: Emergency Medicine | Admitting: Emergency Medicine

## 2024-09-24 DIAGNOSIS — R1032 Left lower quadrant pain: Secondary | ICD-10-CM | POA: Insufficient documentation

## 2024-09-24 DIAGNOSIS — E876 Hypokalemia: Secondary | ICD-10-CM | POA: Insufficient documentation

## 2024-09-24 DIAGNOSIS — R1031 Right lower quadrant pain: Secondary | ICD-10-CM | POA: Insufficient documentation

## 2024-09-24 DIAGNOSIS — R1030 Lower abdominal pain, unspecified: Secondary | ICD-10-CM | POA: Diagnosis present

## 2024-09-24 DIAGNOSIS — R1084 Generalized abdominal pain: Secondary | ICD-10-CM

## 2024-09-24 LAB — COMPREHENSIVE METABOLIC PANEL WITH GFR
ALT: 12 U/L (ref 0–44)
AST: 20 U/L (ref 15–41)
Albumin: 4.7 g/dL (ref 3.5–5.0)
Alkaline Phosphatase: 62 U/L (ref 38–126)
Anion gap: 15 (ref 5–15)
BUN: 5 mg/dL — ABNORMAL LOW (ref 6–20)
CO2: 21 mmol/L — ABNORMAL LOW (ref 22–32)
Calcium: 10 mg/dL (ref 8.9–10.3)
Chloride: 107 mmol/L (ref 98–111)
Creatinine, Ser: 0.7 mg/dL (ref 0.44–1.00)
GFR, Estimated: >60 mL/min (ref >60)
Glucose, Bld: 84 mg/dL (ref 70–99)
Potassium: 3.1 mmol/L — ABNORMAL LOW (ref 3.5–5.1)
Sodium: 143 mmol/L (ref 135–145)
Total Bilirubin: 0.4 mg/dL (ref 0.0–1.2)
Total Protein: 7.8 g/dL (ref 6.5–8.1)

## 2024-09-24 LAB — CBC
HCT: 40.1 % (ref 36.0–46.0)
Hemoglobin: 13.6 g/dL (ref 12.0–15.0)
MCH: 30.6 pg (ref 26.0–34.0)
MCHC: 33.9 g/dL (ref 30.0–36.0)
MCV: 90.3 fL (ref 80.0–100.0)
Platelets: 299 K/uL (ref 150–400)
RBC: 4.44 MIL/uL (ref 3.87–5.11)
RDW: 11.9 % (ref 11.5–15.5)
WBC: 4.3 K/uL (ref 4.0–10.5)
nRBC: 0 % (ref 0.0–0.2)

## 2024-09-24 LAB — MAGNESIUM: Magnesium: 2.3 mg/dL (ref 1.7–2.4)

## 2024-09-24 LAB — URINALYSIS, ROUTINE W REFLEX MICROSCOPIC
Bacteria, UA: NONE SEEN
Bilirubin Urine: NEGATIVE
Glucose, UA: NEGATIVE mg/dL
Ketones, ur: NEGATIVE mg/dL
Leukocytes,Ua: NEGATIVE
Nitrite: NEGATIVE
Protein, ur: NEGATIVE mg/dL
Specific Gravity, Urine: 1.015 (ref 1.005–1.030)
pH: 7 (ref 5.0–8.0)

## 2024-09-24 LAB — HCG, SERUM, QUALITATIVE: Preg, Serum: NEGATIVE

## 2024-09-24 LAB — LIPASE, BLOOD: Lipase: 30 U/L (ref 11–51)

## 2024-09-24 MED ORDER — KETOROLAC TROMETHAMINE 15 MG/ML IJ SOLN
15.0000 mg | Freq: Once | INTRAMUSCULAR | Status: AC
  Administered 2024-09-24: 15 mg via INTRAVENOUS
  Filled 2024-09-24: qty 1

## 2024-09-24 MED ORDER — IOHEXOL 300 MG/ML  SOLN
100.0000 mL | Freq: Once | INTRAMUSCULAR | Status: AC | PRN
  Administered 2024-09-24: 100 mL via INTRAVENOUS

## 2024-09-24 MED ORDER — SODIUM CHLORIDE 0.9 % IV BOLUS
1000.0000 mL | Freq: Once | INTRAVENOUS | Status: AC
  Administered 2024-09-24: 1000 mL via INTRAVENOUS

## 2024-09-24 MED ORDER — ONDANSETRON HCL 4 MG/2ML IJ SOLN
4.0000 mg | Freq: Once | INTRAMUSCULAR | Status: AC
  Administered 2024-09-24: 4 mg via INTRAVENOUS
  Filled 2024-09-24: qty 2

## 2024-09-24 NOTE — ED Provider Notes (Incomplete)
 [place] EMERGENCY DEPARTMENT AT [facility] Provider Note   CSN: [id number] Arrival date & time: 09/24/24  2141     Patient presents with: Abdominal Pain   Tricia Knox is a 23 y.o. female.  Patient with past history significant for ovarian cyst presents to the emergency department with concerns of lower abdominal pain.  She endorses onset of lower abdominal/generalized abdominal pain with associated nausea and vomiting x 1 earlier today.  States she has a history of an ovarian cyst she believes is to her left lower side.  Denies any diarrhea.  No reported sick contacts.  States that she had alcohol yesterday and denies any other substance use.   Abdominal Pain      Prior to Admission medications   Medication Sig Start Date End Date Taking? Authorizing Provider  cefadroxil  (DURICEF) 500 MG capsule Take 1 capsule (500 mg total) by mouth 2 (two) times daily. 03/13/24   Smoot, Lauraine LABOR, PA-C  fluconazole  (DIFLUCAN ) 150 MG tablet Take 1 tablet as needed for vaginal yeast infection.  May repeat in 3 days if symptoms persist. Patient not taking: Reported on 03/25/2023 06/06/21   Molpus, Norleen, MD  HYDROcodone -acetaminophen  (NORCO) 5-325 MG tablet Take 2 tablets by mouth every 4 (four) hours as needed for severe pain. Patient not taking: Reported on 03/25/2023 06/06/21   Molpus, Norleen, MD  ibuprofen  (ADVIL ) 800 MG tablet Take 1 tablet (800 mg total) by mouth every 8 (eight) hours as needed. Patient not taking: Reported on 03/25/2023 04/19/22   Long, Fonda MATSU, MD  methocarbamol  (ROBAXIN ) 500 MG tablet Take 1 tablet (500 mg total) by mouth every 8 (eight) hours as needed for muscle spasms. Patient not taking: Reported on 03/25/2023 04/19/22   Long, Fonda MATSU, MD  naproxen  (NAPROSYN ) 500 MG tablet Take 1 tablet (500 mg total) by mouth 2 (two) times daily. Patient not taking: Reported on 03/25/2023 11/26/21   Theadore Ozell HERO, MD  neomycin -polymyxin-hydrocortisone (CORTISPORIN) OTIC solution Apply  one to two drops to toe daily before dressing Patient not taking: Reported on 03/25/2023 06/08/21   Hyatt, Max T, DPM  ondansetron  (ZOFRAN -ODT) 4 MG disintegrating tablet Take 1 tablet (4 mg total) by mouth every 8 (eight) hours as needed. 03/13/24   Smoot, Lauraine LABOR, PA-C    Allergies: Patient has no known allergies.    Review of Systems  Gastrointestinal:  Positive for abdominal pain.  All other systems reviewed and are negative.   Updated Vital Signs BP 113/66   Pulse 99   Temp 97.8 F (36.6 C) (Oral)   Resp 19   Ht 5' 4 (1.626 m)   Wt 63.5 kg   LMP 08/25/2024   SpO2 98%   BMI 24.03 kg/m   Physical Exam Vitals and nursing note reviewed.  Constitutional:      General: She is not in acute distress.    Appearance: She is well-developed.  HENT:     Head: Normocephalic and atraumatic.  Eyes:     Conjunctiva/sclera: Conjunctivae normal.  Cardiovascular:     Rate and Rhythm: Normal rate and regular rhythm.     Heart sounds: No murmur heard. Pulmonary:     Effort: Pulmonary effort is normal. No respiratory distress.     Breath sounds: Normal breath sounds.  Abdominal:     Palpations: Abdomen is soft.     Tenderness: There is abdominal tenderness in the right lower quadrant, suprapubic area and left lower quadrant. There is guarding. There is no right  CVA tenderness, left CVA tenderness or rebound.  Musculoskeletal:        General: No swelling.     Cervical back: Neck supple.  Skin:    General: Skin is warm and dry.     Capillary Refill: Capillary refill takes less than 2 seconds.  Neurological:     Mental Status: She is alert.  Psychiatric:        Mood and Affect: Mood normal.     (all labs ordered are listed, but only abnormal results are displayed) Labs Reviewed  LIPASE, BLOOD  COMPREHENSIVE METABOLIC PANEL WITH GFR  CBC  URINALYSIS, ROUTINE W REFLEX MICROSCOPIC  HCG, SERUM, QUALITATIVE  URINE DRUG SCREEN    EKG: None  Radiology: No results  found.   Procedures   Medications Ordered in the ED  ondansetron  (ZOFRAN ) injection 4 mg (has no administration in time range)  ketorolac  (TORADOL ) 15 MG/ML injection 15 mg (has no administration in time range)  sodium chloride  0.9 % bolus 1,000 mL (has no administration in time range)                                    Medical Decision Making Amount and/or Complexity of Data Reviewed Labs: ordered. Radiology: ordered.  Risk Prescription drug management.   This patient presents to the ED for concern of abdominal pain, this involves an extensive number of treatment options, and is a complaint that carries with it a high risk of complications and morbidity.  The differential diagnosis includes gastroenteritis, bowel obstruction, diverticulitis, ovarian cyst, tubo-ovarian abscess   Co morbidities that complicate the patient evaluation  Ovarian cyst   Additional history obtained:  Additional history obtained from chart review   Lab Tests:  I Ordered, and personally interpreted labs.  The pertinent results include:  ***   Imaging Studies ordered:  I ordered imaging studies including CT abdomen pelvis I independently visualized and interpreted imaging which showed *** I agree with the radiologist interpretation   Cardiac Monitoring: / EKG:  The patient was maintained on a cardiac monitor.  I personally viewed and interpreted the cardiac monitored which showed an underlying rhythm of: ***   Consultations Obtained:  I requested consultation with the ***,  and discussed lab and imaging findings as well as pertinent plan - they recommend: ***   Problem List / ED Course / Critical interventions / Medication management  *** I ordered medication including ***  for ***  Reevaluation of the patient after these medicines showed that the patient {resolved/improved/worsened:23923::improved} I have reviewed the patients home medicines and have made adjustments as  needed   Social Determinants of Health:  ***   Test / Admission - Considered:  ***   Final diagnoses:  None    ED Discharge Orders     None

## 2024-09-24 NOTE — ED Provider Notes (Signed)
 [place] EMERGENCY DEPARTMENT AT [facility] Provider Note   CSN: [id number] Arrival date & time: 09/24/24  2141     Patient presents with: Abdominal Pain   Tricia Knox is a 23 y.o. female.  Patient with past history significant for ovarian cyst presents to the emergency department with concerns of lower abdominal pain.  She endorses onset of lower abdominal/generalized abdominal pain with associated nausea and vomiting x 1 earlier today.  States she has a history of an ovarian cyst she believes is to her left lower side.  Denies any diarrhea.  No reported sick contacts.  States that she had alcohol yesterday and denies any other substance use.   Abdominal Pain      Prior to Admission medications   Medication Sig Start Date End Date Taking? Authorizing Provider  ketorolac  (TORADOL ) 10 MG tablet Take 1 tablet (10 mg total) by mouth every 6 (six) hours as needed for moderate pain (pain score 4-6). 09/25/24  Yes Valta Dillon A, PA-C  cefadroxil  (DURICEF) 500 MG capsule Take 1 capsule (500 mg total) by mouth 2 (two) times daily. 03/13/24   Smoot, Lauraine LABOR, PA-C  fluconazole  (DIFLUCAN ) 150 MG tablet Take 1 tablet as needed for vaginal yeast infection.  May repeat in 3 days if symptoms persist. Patient not taking: Reported on 03/25/2023 06/06/21   Molpus, Norleen, MD  HYDROcodone -acetaminophen  (NORCO) 5-325 MG tablet Take 2 tablets by mouth every 4 (four) hours as needed for severe pain. Patient not taking: Reported on 03/25/2023 06/06/21   Molpus, Norleen, MD  ibuprofen  (ADVIL ) 800 MG tablet Take 1 tablet (800 mg total) by mouth every 8 (eight) hours as needed. Patient not taking: Reported on 03/25/2023 04/19/22   Long, Fonda MATSU, MD  methocarbamol  (ROBAXIN ) 500 MG tablet Take 1 tablet (500 mg total) by mouth every 8 (eight) hours as needed for muscle spasms. Patient not taking: Reported on 03/25/2023 04/19/22   Long, Fonda MATSU, MD  naproxen  (NAPROSYN ) 500 MG tablet Take 1 tablet (500 mg  total) by mouth 2 (two) times daily. Patient not taking: Reported on 03/25/2023 11/26/21   Theadore Ozell HERO, MD  neomycin -polymyxin-hydrocortisone (CORTISPORIN) OTIC solution Apply one to two drops to toe daily before dressing Patient not taking: Reported on 03/25/2023 06/08/21   Hyatt, Max T, DPM  ondansetron  (ZOFRAN -ODT) 4 MG disintegrating tablet Take 1 tablet (4 mg total) by mouth every 8 (eight) hours as needed. 03/13/24   Smoot, Lauraine LABOR, PA-C    Allergies: Patient has no known allergies.    Review of Systems  Gastrointestinal:  Positive for abdominal pain.  All other systems reviewed and are negative.   Updated Vital Signs BP 113/66   Pulse 99   Temp 97.8 F (36.6 C) (Oral)   Resp 19   Ht 5' 4 (1.626 m)   Wt 63.5 kg   LMP 08/25/2024   SpO2 98%   BMI 24.03 kg/m   Physical Exam Vitals and nursing note reviewed.  Constitutional:      General: She is not in acute distress.    Appearance: She is well-developed.  HENT:     Head: Normocephalic and atraumatic.  Eyes:     Conjunctiva/sclera: Conjunctivae normal.  Cardiovascular:     Rate and Rhythm: Normal rate and regular rhythm.     Heart sounds: No murmur heard. Pulmonary:     Effort: Pulmonary effort is normal. No respiratory distress.     Breath sounds: Normal breath sounds.  Abdominal:  Palpations: Abdomen is soft.     Tenderness: There is abdominal tenderness in the right lower quadrant, suprapubic area and left lower quadrant. There is guarding. There is no right CVA tenderness, left CVA tenderness or rebound.  Musculoskeletal:        General: No swelling.     Cervical back: Neck supple.  Skin:    General: Skin is warm and dry.     Capillary Refill: Capillary refill takes less than 2 seconds.  Neurological:     Mental Status: She is alert.  Psychiatric:        Mood and Affect: Mood normal.     (all labs ordered are listed, but only abnormal results are displayed) Labs Reviewed  COMPREHENSIVE METABOLIC  PANEL WITH GFR - Abnormal; Notable for the following components:      Result Value   Potassium 3.1 (*)    CO2 21 (*)    BUN 5 (*)    All other components within normal limits  URINALYSIS, ROUTINE W REFLEX MICROSCOPIC - Abnormal; Notable for the following components:   Hgb urine dipstick MODERATE (*)    All other components within normal limits  LIPASE, BLOOD  CBC  HCG, SERUM, QUALITATIVE  MAGNESIUM  URINE DRUG SCREEN    EKG: None  Radiology: CT ABDOMEN PELVIS W CONTRAST Result Date: 09/25/2024 EXAM: CT ABDOMEN AND PELVIS WITH CONTRAST 09/24/2024 11:55:33 PM TECHNIQUE: CT of the abdomen and pelvis was performed with the administration of 100 mL iohexol  (OMNIPAQUE ) 300 MG/ML solution. Multiplanar reformatted images are provided for review. Automated exposure control, iterative reconstruction, and/or weight-based adjustment of the mA/kV was utilized to reduce the radiation dose to as low as reasonably achievable. COMPARISON: Comparison study 08/23/2024. CLINICAL HISTORY: LLQ abdominal pain; RLQ abdominal pain. FINDINGS: LOWER CHEST: Prominent bases are free of acute infiltrate or sizable effusion. LIVER: The liver is within normal limits. GALLBLADDER AND BILE DUCTS: The gallbladder is within normal limits. No biliary ductal dilatation. SPLEEN: The spleen is within normal limits. PANCREAS: The pancreas is within normal limits. ADRENAL GLANDS: The adrenal glands are unremarkable. KIDNEYS, URETERS AND BLADDER: The kidneys demonstrate a normal enhancement pattern bilaterally. No renal calculi are seen. No stones in the ureters. No hydronephrosis. The bladder is decompressed. GI AND BOWEL: Stomach and small bowel are unremarkable. No obstructive or inflammatory changes of the colon are seen. The appendix is within normal limits. PERITONEUM AND RETROPERITONEUM: No free fluid is noted. No free air. VASCULATURE: The aorta is within normal limits. LYMPH NODES: No lymphadenopathy. REPRODUCTIVE ORGANS:  Uterus shows no acute abnormality. No definitive adnexal mass is seen. BONES AND SOFT TISSUES: No acute bony abnormality is seen. No focal soft tissue abnormality. IMPRESSION: 1. No acute findings in the abdomen or pelvis. Electronically signed by: Oneil Devonshire MD 09/25/2024 12:05 AM EST RP Workstation: [id number]     Procedures   Medications Ordered in the ED  ondansetron  (ZOFRAN ) injection 4 mg (4 mg Intravenous Given 09/24/24 2339)  ketorolac  (TORADOL ) 15 MG/ML injection 15 mg (15 mg Intravenous Given 09/24/24 2339)  sodium chloride  0.9 % bolus 1,000 mL (0 mLs Intravenous Stopped 09/25/24 0031)  iohexol  (OMNIPAQUE ) 300 MG/ML solution 100 mL (100 mLs Intravenous Contrast Given 09/24/24 2350)                                    Medical Decision Making Amount and/or Complexity of Data Reviewed Labs: ordered. Radiology: ordered.  Risk Prescription drug management.   This patient presents to the ED for concern of abdominal pain, this involves an extensive number of treatment options, and is a complaint that carries with it a high risk of complications and morbidity.  The differential diagnosis includes gastroenteritis, bowel obstruction, diverticulitis, ovarian cyst, tubo-ovarian abscess   Co morbidities that complicate the patient evaluation  Ovarian cyst   Additional history obtained:  Additional history obtained from chart review   Lab Tests:  I Ordered, and personally interpreted labs.  The pertinent results include: CBC unremarkable, CMP with mild hypokalemia at 3.1, lipase normal at 30, UA with blood present but no signs of infection, hCG negative, magnesium normal at 2.3   Imaging Studies ordered:  I ordered imaging studies including CT abdomen pelvis I independently visualized and interpreted imaging which showed no acute findings in the abdomen pelvis I agree with the radiologist interpretation   Consultations Obtained:  I requested consultation with none,   and discussed lab and imaging findings as well as pertinent plan - they recommend: N/A   Problem List / ED Course / Critical interventions / Medication management  Patient with past history significant for ovarian cyst presents to the emergency department concerns of abdominal pain.  States that she began experience otherwise abdominal pain with nausea and vomiting and states that she has not been able to get pain well under control. She denies any diarrhea. States that she has periodically experienced similar pain to this but has been unable to find exact cause of her pain. States this pain is similar to pain that she has had on numerous visits in the past. Exam reveals lower abdominal pain. GU exam deferred but denies vaginal discharge or drainage. Uncomfortable but well appearing.  Workup reveals reassuring findings.  No significant leukocytosis only mild electrolyte derangement with potassium at 3.1.  CT abdomen pelvis ordered for further assessment given area pain. CT abdomen pelvis is negative for any acute findings.  Toradol , fluids, Zofran  given for symptom control. I reassessed patient and informed her of reassuring findings.  She appears stable and she is requesting to be discharged home.  Given that she had significant improvement with Toradol , will discharge home with prescription for p.o. Toradol  for the next several days.  Advised patient to follow-up closely with her PCP.  She is otherwise stable for outpatient follow-up and discharged home. I ordered medication including fluids, Toradol , Zofran  for dehydration, pain, nausea Reevaluation of the patient after these medicines showed that the patient improved I have reviewed the patients home medicines and have made adjustments as needed   Social Determinants of Health:  None   Test / Admission - Considered:  Admission considered but stable for outpatient follow-up.   Final diagnoses:  Generalized abdominal pain    ED  Discharge Orders          Ordered    ketorolac  (TORADOL ) 10 MG tablet  Every 6 hours PRN        09/25/24 0029               Pragya Lofaso A, PA-C 09/25/24 0050    Trine Raynell Moder, MD 09/26/24 [phone]

## 2024-09-24 NOTE — ED Triage Notes (Signed)
 Pt POV with friend d/t ABD pian all over with N/V - pt states she has known ovarian cyst.

## 2024-09-25 MED ORDER — KETOROLAC TROMETHAMINE 10 MG PO TABS: 10.0000 mg | ORAL_TABLET | Freq: Four times a day (QID) | ORAL | 0 refills | Status: DC | PRN

## 2024-09-25 NOTE — Discharge Instructions (Signed)
 You were seen in the ER today for concerns of abdominal pain. Your labs and imaging were thankfully reassuring and I am unsure what exactly is causing your current pain. I have sent a prescription for toradol  to your pharmacy to help with some of this pain as this seemed to help while you were in the ER. For any concerns of worsening pain, return to the ER.

## 2024-10-10 ENCOUNTER — Other Ambulatory Visit: Payer: Self-pay

## 2024-10-10 ENCOUNTER — Encounter (HOSPITAL_COMMUNITY): Payer: Self-pay

## 2024-10-10 ENCOUNTER — Emergency Department (HOSPITAL_COMMUNITY)
Admission: EM | Admit: 2024-10-10 | Discharge: 2024-10-11 | Disposition: A | Discharge status: Home / Self Care | Attending: Emergency Medicine | Admitting: Emergency Medicine

## 2024-10-10 DIAGNOSIS — R1031 Right lower quadrant pain: Secondary | ICD-10-CM | POA: Insufficient documentation

## 2024-10-10 DIAGNOSIS — R1032 Left lower quadrant pain: Secondary | ICD-10-CM | POA: Diagnosis not present

## 2024-10-10 LAB — URINALYSIS, ROUTINE W REFLEX MICROSCOPIC
Bacteria, UA: NONE SEEN
Bilirubin Urine: NEGATIVE
Glucose, UA: NEGATIVE mg/dL
Hgb urine dipstick: NEGATIVE
Ketones, ur: NEGATIVE mg/dL
Nitrite: NEGATIVE
Protein, ur: NEGATIVE mg/dL
Specific Gravity, Urine: 1.004 — ABNORMAL LOW (ref 1.005–1.030)
pH: 7 (ref 5.0–8.0)

## 2024-10-10 LAB — CBC
HCT: 38.2 % (ref 36.0–46.0)
Hemoglobin: 12.8 g/dL (ref 12.0–15.0)
MCH: 30.8 pg (ref 26.0–34.0)
MCHC: 33.5 g/dL (ref 30.0–36.0)
MCV: 91.8 fL (ref 80.0–100.0)
Platelets: 259 K/uL (ref 150–400)
RBC: 4.16 MIL/uL (ref 3.87–5.11)
RDW: 12.2 % (ref 11.5–15.5)
WBC: 6.2 K/uL (ref 4.0–10.5)
nRBC: 0 % (ref 0.0–0.2)

## 2024-10-10 LAB — COMPREHENSIVE METABOLIC PANEL WITH GFR
ALT: 8 U/L (ref 0–44)
AST: 19 U/L (ref 15–41)
Albumin: 4.6 g/dL (ref 3.5–5.0)
Alkaline Phosphatase: 70 U/L (ref 38–126)
Anion gap: 15 (ref 5–15)
BUN: 14 mg/dL (ref 6–20)
CO2: 19 mmol/L — ABNORMAL LOW (ref 22–32)
Calcium: 9.7 mg/dL (ref 8.9–10.3)
Chloride: 106 mmol/L (ref 98–111)
Creatinine, Ser: 0.72 mg/dL (ref 0.44–1.00)
GFR, Estimated: >60 mL/min (ref >60)
Glucose, Bld: 89 mg/dL (ref 70–99)
Potassium: 3.2 mmol/L — ABNORMAL LOW (ref 3.5–5.1)
Sodium: 140 mmol/L (ref 135–145)
Total Bilirubin: 0.2 mg/dL (ref 0.0–1.2)
Total Protein: 7.6 g/dL (ref 6.5–8.1)

## 2024-10-10 LAB — LIPASE, BLOOD: Lipase: 39 U/L (ref 11–51)

## 2024-10-10 LAB — HCG, SERUM, QUALITATIVE: Preg, Serum: NEGATIVE

## 2024-10-10 NOTE — Discharge Instructions (Signed)
 Follow up with a PCP and [facility] when you can.  Please return for sudden worsening pain, fever, inability to eat or drink.

## 2024-10-10 NOTE — ED Provider Notes (Signed)
 [place] EMERGENCY DEPARTMENT AT [facility] Provider Note   CSN: [id number] Arrival date & time: 10/10/24  2221     Patient presents with: Abdominal Cramping   Tricia Knox is a 23 y.o. female.   [name] with a chief complaints of lower abdominal cramping.  This has been going on for about 5 days now.  Started on the right side and then moved to the left side denies diarrhea denies constipation denies urinary symptoms.  No fevers no vomiting.  Has had episodes like this before.  She thinks maybe it was due to an ovarian cyst.  She looked up her symptoms on chat GPT and it told her that she might be pregnant.  She might have an ovarian cyst.  She came in for evaluation.   Abdominal Cramping       Prior to Admission medications   Medication Sig Start Date End Date Taking? Authorizing Provider  cefadroxil  (DURICEF) 500 MG capsule Take 1 capsule (500 mg total) by mouth 2 (two) times daily. 03/13/24   Smoot, Lauraine LABOR, PA-C  fluconazole  (DIFLUCAN ) 150 MG tablet Take 1 tablet as needed for vaginal yeast infection.  May repeat in 3 days if symptoms persist. Patient not taking: Reported on 03/25/2023 06/06/21   Molpus, Norleen, MD  HYDROcodone -acetaminophen  (NORCO) 5-325 MG tablet Take 2 tablets by mouth every 4 (four) hours as needed for severe pain. Patient not taking: Reported on 03/25/2023 06/06/21   Molpus, Norleen, MD  ibuprofen  (ADVIL ) 800 MG tablet Take 1 tablet (800 mg total) by mouth every 8 (eight) hours as needed. Patient not taking: Reported on 03/25/2023 04/19/22   Long, Fonda MATSU, MD  ketorolac  (TORADOL ) 10 MG tablet Take 1 tablet (10 mg total) by mouth every 6 (six) hours as needed for moderate pain (pain score 4-6). 09/25/24   Zelaya, Oscar A, PA-C  methocarbamol  (ROBAXIN ) 500 MG tablet Take 1 tablet (500 mg total) by mouth every 8 (eight) hours as needed for muscle spasms. Patient not taking: Reported on 03/25/2023 04/19/22   Long, Fonda MATSU, MD  naproxen  (NAPROSYN ) 500 MG  tablet Take 1 tablet (500 mg total) by mouth 2 (two) times daily. Patient not taking: Reported on 03/25/2023 11/26/21   Theadore Ozell HERO, MD  neomycin -polymyxin-hydrocortisone (CORTISPORIN) OTIC solution Apply one to two drops to toe daily before dressing Patient not taking: Reported on 03/25/2023 06/08/21   Hyatt, Max T, DPM  ondansetron  (ZOFRAN -ODT) 4 MG disintegrating tablet Take 1 tablet (4 mg total) by mouth every 8 (eight) hours as needed. 03/13/24   Smoot, Lauraine LABOR, PA-C    Allergies: Patient has no known allergies.    Review of Systems  Updated Vital Signs BP (!) 131/90 (BP Location: Left Arm)   Pulse 96   Temp 98.1 F (36.7 C) (Oral)   Resp 18   LMP 08/25/2024   SpO2 100%   Physical Exam Vitals and nursing note reviewed.  Constitutional:      General: She is not in acute distress.    Appearance: She is well-developed. She is not diaphoretic.  HENT:     Head: Normocephalic and atraumatic.  Eyes:     Pupils: Pupils are equal, round, and reactive to light.  Cardiovascular:     Rate and Rhythm: Normal rate and regular rhythm.     Heart sounds: No murmur heard.    No friction rub. No gallop.  Pulmonary:     Effort: Pulmonary effort is normal.  Breath sounds: No wheezing or rales.  Abdominal:     General: There is no distension.     Palpations: Abdomen is soft.     Tenderness: There is no abdominal tenderness.     Comments: Benign abdominal exam  Musculoskeletal:        General: No tenderness.     Cervical back: Normal range of motion and neck supple.  Skin:    General: Skin is warm and dry.  Neurological:     Mental Status: She is alert and oriented to person, place, and time.  Psychiatric:        Behavior: Behavior normal.     (all labs ordered are listed, but only abnormal results are displayed) Labs Reviewed  COMPREHENSIVE METABOLIC PANEL WITH GFR - Abnormal; Notable for the following components:      Result Value   Potassium 3.2 (*)    CO2 19 (*)    All  other components within normal limits  URINALYSIS, ROUTINE W REFLEX MICROSCOPIC - Abnormal; Notable for the following components:   Color, Urine COLORLESS (*)    Specific Gravity, Urine 1.004 (*)    Leukocytes,Ua TRACE (*)    All other components within normal limits  LIPASE, BLOOD  CBC  HCG, SERUM, QUALITATIVE    EKG: None  Radiology: No results found.   Procedures   Medications Ordered in the ED - No data to display                                  Medical Decision Making  [name] with a chief complaints of lower abdominal cramping.  Off and on for about 5 days.  She is well-appearing nontoxic.  Has benign exam.  No significant electrolyte abnormalities no acute anemia.  Pregnancy test negative.  UA negative for infection.  LFTs and lipase are unremarkable.  I discussed results with patient and her friend.  I encouraged outpatient follow-up.  The patient told me that she does not have insurance and has been seen in the ED multiple times for this because she does not have to pay for it.  I discussed limitations of being seen in the emergency department.  Encouraged her to try and follow-up with outpatient when she could.  11:59 PM:  I have discussed the diagnosis/risks/treatment options with the patient and friend.  Evaluation and diagnostic testing in the emergency department does not suggest an emergent condition requiring admission or immediate intervention beyond what has been performed at this time.  They will follow up with PCP, GYN. We also discussed returning to the ED immediately if new or worsening sx occur. We discussed the sx which are most concerning (e.g., sudden worsening pain, fever, inability to tolerate by mouth) that necessitate immediate return. Medications administered to the patient during their visit and any new prescriptions provided to the patient are listed below.  Medications given during this visit Medications - No data to display   The patient appears  reasonably screen and/or stabilized for discharge and I doubt any other medical condition or other [facility] requiring further screening, evaluation, or treatment in the ED at this time prior to discharge.       Final diagnoses:  Bilateral lower abdominal cramping    ED Discharge Orders     None          Emil Share, DO 10/10/24 2359

## 2024-10-10 NOTE — ED Triage Notes (Signed)
 Pt. Arrives for abdominal cramping to the RLQ. States that she has a hx of ovarian cyst, but the last couple of times, she has been told that she has not had one. States that she asked chatGPT what was wrong with her and the website said that she could be pregnant. Pt. Requesting serum pregnancy test as well. Reports that when cramping starts it sends a tingle down her leg.

## 2024-10-20 DIAGNOSIS — N83209 Unspecified ovarian cyst, unspecified side: Secondary | ICD-10-CM | POA: Insufficient documentation

## 2024-10-22 DIAGNOSIS — R109 Unspecified abdominal pain: Secondary | ICD-10-CM | POA: Insufficient documentation

## 2024-10-27 ENCOUNTER — Emergency Department (HOSPITAL_COMMUNITY)

## 2024-10-27 ENCOUNTER — Encounter (HOSPITAL_COMMUNITY): Payer: Self-pay

## 2024-10-27 ENCOUNTER — Other Ambulatory Visit: Payer: Self-pay

## 2024-10-27 ENCOUNTER — Emergency Department (HOSPITAL_COMMUNITY)
Admission: EM | Admit: 2024-10-27 | Discharge: 2024-10-27 | Disposition: A | Discharge status: Home / Self Care | Attending: Emergency Medicine | Admitting: Emergency Medicine

## 2024-10-27 DIAGNOSIS — N3090 Cystitis, unspecified without hematuria: Secondary | ICD-10-CM

## 2024-10-27 DIAGNOSIS — R1031 Right lower quadrant pain: Secondary | ICD-10-CM | POA: Diagnosis present

## 2024-10-27 DIAGNOSIS — E876 Hypokalemia: Secondary | ICD-10-CM | POA: Diagnosis not present

## 2024-10-27 LAB — CBC WITH DIFFERENTIAL/PLATELET
Abs Immature Granulocytes: 0.02 K/uL (ref 0.00–0.07)
Basophils Absolute: 0 K/uL (ref 0.0–0.1)
Basophils Relative: 0 %
Eosinophils Absolute: 0 K/uL (ref 0.0–0.5)
Eosinophils Relative: 0 %
HCT: 38.1 % (ref 36.0–46.0)
Hemoglobin: 12.7 g/dL (ref 12.0–15.0)
Immature Granulocytes: 0 %
Lymphocytes Relative: 16 %
Lymphs Abs: 1.3 K/uL (ref 0.7–4.0)
MCH: 30.4 pg (ref 26.0–34.0)
MCHC: 33.3 g/dL (ref 30.0–36.0)
MCV: 91.1 fL (ref 80.0–100.0)
Monocytes Absolute: 0.9 K/uL (ref 0.1–1.0)
Monocytes Relative: 11 %
Neutro Abs: 6 K/uL (ref 1.7–7.7)
Neutrophils Relative %: 73 %
Platelets: 300 K/uL (ref 150–400)
RBC: 4.18 MIL/uL (ref 3.87–5.11)
RDW: 12.4 % (ref 11.5–15.5)
WBC: 8.2 K/uL (ref 4.0–10.5)
nRBC: 0 % (ref 0.0–0.2)

## 2024-10-27 LAB — LIPASE, BLOOD: Lipase: 15 U/L (ref 11–51)

## 2024-10-27 LAB — COMPREHENSIVE METABOLIC PANEL WITH GFR
ALT: 26 U/L (ref 0–44)
AST: 47 U/L — ABNORMAL HIGH (ref 15–41)
Albumin: 4.7 g/dL (ref 3.5–5.0)
Alkaline Phosphatase: 78 U/L (ref 38–126)
Anion gap: 14 (ref 5–15)
BUN: 8 mg/dL (ref 6–20)
CO2: 23 mmol/L (ref 22–32)
Calcium: 9.7 mg/dL (ref 8.9–10.3)
Chloride: 102 mmol/L (ref 98–111)
Creatinine, Ser: 0.61 mg/dL (ref 0.44–1.00)
GFR, Estimated: >60 mL/min (ref >60)
Glucose, Bld: 91 mg/dL (ref 70–99)
Potassium: 3.1 mmol/L — ABNORMAL LOW (ref 3.5–5.1)
Sodium: 139 mmol/L (ref 135–145)
Total Bilirubin: 0.3 mg/dL (ref 0.0–1.2)
Total Protein: 8.2 g/dL — ABNORMAL HIGH (ref 6.5–8.1)

## 2024-10-27 LAB — URINALYSIS, ROUTINE W REFLEX MICROSCOPIC
Bilirubin Urine: NEGATIVE
Glucose, UA: NEGATIVE mg/dL
Ketones, ur: NEGATIVE mg/dL
Nitrite: NEGATIVE
Protein, ur: NEGATIVE mg/dL
Specific Gravity, Urine: 1.002 — ABNORMAL LOW (ref 1.005–1.030)
pH: 7 (ref 5.0–8.0)

## 2024-10-27 LAB — HCG, SERUM, QUALITATIVE: Preg, Serum: NEGATIVE

## 2024-10-27 MED ORDER — ONDANSETRON HCL 4 MG/2ML IJ SOLN
4.0000 mg | Freq: Once | INTRAMUSCULAR | Status: AC
  Administered 2024-10-27: 4 mg via INTRAVENOUS
  Filled 2024-10-27: qty 2

## 2024-10-27 MED ORDER — CEPHALEXIN 500 MG PO CAPS
500.0000 mg | ORAL_CAPSULE | Freq: Once | ORAL | Status: AC
  Administered 2024-10-27: 500 mg via ORAL
  Filled 2024-10-27: qty 1

## 2024-10-27 MED ORDER — PHENAZOPYRIDINE HCL 200 MG PO TABS: 200.0000 mg | ORAL_TABLET | Freq: Three times a day (TID) | ORAL | 0 refills | Status: AC

## 2024-10-27 MED ORDER — CEPHALEXIN 500 MG PO CAPS: 500.0000 mg | ORAL_CAPSULE | Freq: Four times a day (QID) | ORAL | 0 refills | Status: DC

## 2024-10-27 MED ORDER — MORPHINE SULFATE (PF) 4 MG/ML IV SOLN
4.0000 mg | Freq: Once | INTRAVENOUS | Status: AC
  Administered 2024-10-27: 4 mg via INTRAVENOUS
  Filled 2024-10-27: qty 1

## 2024-10-27 MED ORDER — IOHEXOL 300 MG/ML  SOLN
100.0000 mL | Freq: Once | INTRAMUSCULAR | Status: AC | PRN
  Administered 2024-10-27: 100 mL via INTRAVENOUS

## 2024-10-27 MED ORDER — PHENAZOPYRIDINE HCL 200 MG PO TABS
200.0000 mg | ORAL_TABLET | Freq: Once | ORAL | Status: AC
  Administered 2024-10-27: 200 mg via ORAL
  Filled 2024-10-27: qty 1

## 2024-10-27 MED ORDER — POTASSIUM CHLORIDE CRYS ER 20 MEQ PO TBCR
80.0000 meq | EXTENDED_RELEASE_TABLET | Freq: Once | ORAL | Status: AC
  Administered 2024-10-27: 80 meq via ORAL
  Filled 2024-10-27: qty 4

## 2024-10-27 NOTE — ED Provider Notes (Signed)
 [place] EMERGENCY DEPARTMENT AT [facility] Provider Note   CSN: [id number] Arrival date & time: 10/27/24  9680     History Chief Complaint  Patient presents with   Abdominal Pain    HPI: Tricia Knox is a 23 y.o. female with history pertinent for ovarian cyst, who presents complaining of abdominal pain. Patient arrived via POV companied by friends.  History provided by patient.  No interpreter required during this encounter.  Patient reports that she has a history of ovarian cysts, as well as constipation.  Reports that she has come to the emergency department twice in the past 2 months and was told that her abdominal pain was related to constipation.  Reports that she has not had a bowel movement in 12 days.  Reports also that she has a history of ovarian cysts, and 1 week ago on 12/7 she was in [place]  visiting her boyfriend who is having surgery, and she developed a similar abdominal pain and sought medical care at [facility] who diagnosed her with bilateral ruptured ovarian cysts.  Reports that that she has had persistent mild to moderate pain in her bilateral lower quadrant since that time, however pain acutely worsened tonight.  Reports that she also had leakage of fluid tonight, however believes that it was not urine, denies possibility of pregnancy, is unsure whether or not they performed a pregnancy test at OSH on 12/7.  Denies fever, chills, chest pain, shortness of breath, reports ongoing severe lower abdominal pain.  Nuys dysuria, urgency, frequency  Patient's recorded medical, surgical, social, medication list and allergies were reviewed in the Snapshot window as part of the initial history.   Prior to Admission medications  Medication Sig Start Date End Date Taking? Authorizing Provider  cephALEXin  (KEFLEX ) 500 MG capsule Take 1 capsule (500 mg total) by mouth 4 (four) times daily. 10/27/24  Yes Rogelia Jerilynn RAMAN, MD  phenazopyridine  (PYRIDIUM ) 200 MG tablet Take 1  tablet (200 mg total) by mouth 3 (three) times daily. 10/27/24  Yes Rogelia Jerilynn RAMAN, MD  cefadroxil  (DURICEF) 500 MG capsule Take 1 capsule (500 mg total) by mouth 2 (two) times daily. 03/13/24   Smoot, Lauraine LABOR, PA-C  fluconazole  (DIFLUCAN ) 150 MG tablet Take 1 tablet as needed for vaginal yeast infection.  May repeat in 3 days if symptoms persist. Patient not taking: Reported on 03/25/2023 06/06/21   Molpus, Norleen, MD  HYDROcodone -acetaminophen  (NORCO) 5-325 MG tablet Take 2 tablets by mouth every 4 (four) hours as needed for severe pain. Patient not taking: Reported on 03/25/2023 06/06/21   Molpus, Norleen, MD  ibuprofen  (ADVIL ) 800 MG tablet Take 1 tablet (800 mg total) by mouth every 8 (eight) hours as needed. Patient not taking: Reported on 03/25/2023 04/19/22   Long, Fonda MATSU, MD  ketorolac  (TORADOL ) 10 MG tablet Take 1 tablet (10 mg total) by mouth every 6 (six) hours as needed for moderate pain (pain score 4-6). 09/25/24   Zelaya, Oscar A, PA-C  methocarbamol  (ROBAXIN ) 500 MG tablet Take 1 tablet (500 mg total) by mouth every 8 (eight) hours as needed for muscle spasms. Patient not taking: Reported on 03/25/2023 04/19/22   Long, Fonda MATSU, MD  naproxen  (NAPROSYN ) 500 MG tablet Take 1 tablet (500 mg total) by mouth 2 (two) times daily. Patient not taking: Reported on 03/25/2023 11/26/21   Theadore Ozell HERO, MD  neomycin -polymyxin-hydrocortisone (CORTISPORIN) OTIC solution Apply one to two drops to toe daily before dressing Patient not taking: Reported on 03/25/2023 06/08/21  Hyatt, Max T, DPM  ondansetron  (ZOFRAN -ODT) 4 MG disintegrating tablet Take 1 tablet (4 mg total) by mouth every 8 (eight) hours as needed. 03/13/24   Smoot, Lauraine LABOR, PA-C     Allergies: Patient has no known allergies.   Review of Systems   ROS as per HPI  Physical Exam Updated Vital Signs BP 118/74   Pulse 83   Temp 98.3 F (36.8 C) (Oral)   Resp 18   SpO2 100%  Physical Exam Vitals and nursing note reviewed.   Constitutional:      General: She is not in acute distress.    Appearance: She is well-developed.  HENT:     Head: Normocephalic and atraumatic.  Eyes:     Conjunctiva/sclera: Conjunctivae normal.  Cardiovascular:     Rate and Rhythm: Normal rate and regular rhythm.     Heart sounds: No murmur heard. Pulmonary:     Effort: Pulmonary effort is normal. No respiratory distress.     Breath sounds: Normal breath sounds.  Abdominal:     Palpations: Abdomen is soft.     Tenderness: There is abdominal tenderness in the right lower quadrant, suprapubic area and left lower quadrant. There is no right CVA tenderness or left CVA tenderness.  Musculoskeletal:        General: No swelling.     Cervical back: Neck supple.  Skin:    General: Skin is warm and dry.     Capillary Refill: Capillary refill takes less than 2 seconds.  Neurological:     Mental Status: She is alert.  Psychiatric:        Mood and Affect: Mood normal.     ED Course/ Medical Decision Making/ A&P    Procedures Procedures   Medications Ordered in ED Medications  morphine  (PF) 4 MG/ML injection 4 mg (4 mg Intravenous Given 10/27/24 0443)  ondansetron  (ZOFRAN ) injection 4 mg (4 mg Intravenous Given 10/27/24 0443)  iohexol  (OMNIPAQUE ) 300 MG/ML solution 100 mL (100 mLs Intravenous Contrast Given 10/27/24 0519)  potassium chloride  SA (KLOR-CON  M) CR tablet 80 mEq (80 mEq Oral Given 10/27/24 0536)  cephALEXin  (KEFLEX ) capsule 500 mg (500 mg Oral Given 10/27/24 0611)  phenazopyridine  (PYRIDIUM ) tablet 200 mg (200 mg Oral Given 10/27/24 9388)    Medical Decision Making:   Tricia Knox is a 23 y.o. female who presents for abdominal and pelvic pain as per above.  Physical exam is pertinent for bilateral lower quadrant and suprapubic pain.  The differential includes but is not limited to cystitis, pyelonephritis, nephrolithiasis, ovarian torsion, ovarian cyst, appendicitis, intra-abdominal infection.  Independent  historian: None  External data reviewed: No pertinent external data  Initial Plan:  Screening labs including CBC and Metabolic panel to evaluate for infectious or metabolic etiology of disease.  Screening hCG to evaluate for pregnancy Screening lipase for pancreatitis in the setting of abdominal pain Urinalysis with reflex culture ordered to evaluate for UTI or relevant urologic/nephrologic pathology.  CT abdomen pelvis to evaluate for structural/infectious intra-abdominal pathology.  Transvaginal ultrasound to evaluate for structural/vascular pelvic/ovarian pathology Objective evaluation as below reviewed   Labs: Ordered, Independent interpretation, and Details: CBC without leukocytosis, anemia, thrombocytopenia.  CMP without AKI, emergent electrolyte derangement, emergent LFT abnormality.  Patient does have mild hypokalemia to 3.1.  Lipase WNL.,  hCG negative.  UA with few RBCs, rare bacteria, moderate LE, equivocal for UTI.  Radiology: Ordered, Independent interpretation, Details: Personally reviewed transvaginal ultrasound, I do not appreciate evidence of pregnancy, I did do appreciate  flow to bilateral ovaries.  Reviewed CT of the abdomen and pelvis, I do appreciate thickening of the bladder wall, I do not appreciate focal fluid collection, intra-abdominal free air, free fluid, obstructive bowel gas pattern, hyperenhancement or fat stranding, and All images reviewed independently.  Agree with radiology report at this time.   CT ABDOMEN PELVIS W CONTRAST Result Date: 10/27/2024 EXAM: CT ABDOMEN AND PELVIS WITH CONTRAST 10/27/2024 05:22:49 AM TECHNIQUE: CT of the abdomen and pelvis was performed with the administration of 100 mL of iohexol  (OMNIPAQUE ) 300 MG/ML solution. Multiplanar reformatted images are provided for review. Automated exposure control, iterative reconstruction, and/or weight-based adjustment of the mA/kV was utilized to reduce the radiation dose to as low as reasonably  achievable. COMPARISON: CT abdomen and pelvis 09/24/2024. Pelvis ultrasound with doppler reported separately today, 10/27/2024. CLINICAL HISTORY: 23 year old female with right lower quadrant pain for 1 week. FINDINGS: LOWER CHEST: Visible lung bases are normal. LIVER: The liver is unremarkable. GALLBLADDER AND BILE DUCTS: Gallbladder is unremarkable. No biliary ductal dilatation. SPLEEN: No acute abnormality. PANCREAS: No acute abnormality. ADRENAL GLANDS: No acute abnormality. KIDNEYS, URETERS AND BLADDER: Renal enhancement is symmetric and within normal limits. No stones in the kidneys or ureters. No hydronephrosis. No perinephric or periureteral stranding. The urinary bladder is decompressed but is diffusely thickened (sagittal image 89 and series 12 image 71) with mucosal hyperenhancement. No gas within the bladder. No additional regional inflammation. GI AND BOWEL: Nondilated stomach and small bowel. Redundant sigmoid colon in the lower abdomen. Mild to moderate large bowel retained gas and stool throughout. Redundant transverse colon. Normal gas containing retrocecal appendix on series 3 image 45. No large bowel inflammation. There is no bowel obstruction. PERITONEUM AND RETROPERITONEUM: No ascites. No free air. VASCULATURE: Portal venous system, major arterial structures in the abdomen and pelvis appear patent and normal. Aorta is normal in caliber. LYMPH NODES: No lymphadenopathy. REPRODUCTIVE ORGANS: Incidental tampon in place. Uterus and adnexa appear stable and within normal limits. See also pelvis ultrasound today. BONES AND SOFT TISSUES: Incidental umbilical piercing with mild streak artifact. No acute osseous abnormality. No focal soft tissue abnormality. IMPRESSION: 1. Bulky thickening of the urinary bladder wall with mucosal hyperenhancement. Consider acute UIT and/or cystitis. 2. Normal appendix.  Redundant large bowel with mild to moderate retained stool. Electronically signed by: Helayne Hurst MD  10/27/2024 05:33 AM EST RP Workstation: [id number]   US  PELVIC COMPLETE W TRANSVAGINAL AND TORSION R/O Result Date: 10/27/2024 EXAM: US  Pelvis, Complete Transvaginal and Transabdominal with Doppler TECHNIQUE: Transabdominal and transvaginal pelvic duplex ultrasound using B-mode/gray scaled imaging with Doppler spectral analysis and color flow was obtained. COMPARISON: CT abdomen and pelvis 10/27/2024 reported separately. Pelvis ultrasound 02/25/2024. CLINICAL HISTORY: 23 year old female with 1 week of pelvic pain. FINDINGS: UTERUS: Uterus measures 8.5 x 4.9 x 5.9 cm. Estimated volume 129 ml. Uterus demonstrates normal myometrial echotexture. ENDOMETRIAL STRIPE: Endometrium measures 6 mm and appears normal. RIGHT OVARY: Right ovary measures 3.4 x 2.4 x 2.3 cm. Estimated volume 10 ml. Small but increased follicles compared to 02/25/2024, etiology and significance unclear. Otherwise appears normal. There is normal color and spectral Doppler vascularity. LEFT OVARY: Left ovary measures 2.9 x 2.4 x 3.7 cm. Estimated volume 14 ml. Small but increased follicles compared to 02/25/2024, etiology and significance unclear. Otherwise appears normal. There is normal color and spectral Doppler vascularity. FREE FLUID: Trace simple appearing free fluid in the cul de sac (series 1 image 25), appears physiologic. IMPRESSION: 1. Negative for ovarian torsion. Normal  uterus and endometrium. Electronically signed by: Helayne Hurst MD 10/27/2024 05:28 AM EST RP Workstation: [id number]    EKG/Medicine tests: Not indicated EKG Interpretation:    Interventions: Zofran , morphine , potassium repletion, Pyridium , Keflex   See the EMR for full details regarding lab and imaging results.  Patient presents to the emergency department for severe abdominal pain, has had a history of constipation as well as ovarian cysts.  Patient also has suprapubic tenderness to palpation.  Pelvic location of symptoms is concerning for ovarian pathology  as well as intra-abdominal pathology such as appendicitis, constipation, therefore patient does warrant broad lab workup as well as imaging as per initial plan above.  Patient symptoms are treated with morphine  and Zofran .  On reevaluation, patient had significant improvement in her symptoms.  Labs do demonstrate hypokalemia which was repleted orally.  Possible UTI, otherwise no leukocytosis which makes severe infectious/inflammatory etiology less likely.  CT does demonstrate some mild to moderate, stool burden, however no acute intra-abdominal process, no evidence of ovarian torsion or cysts on transvaginal ultrasound.  Patient does have bladder wall thickening, which in combination with patient's UA, suprapubic tenderness, is concerning for cystitis.  Thus we will treat patient with course of Keflex  and Pyridium , and refer to urology for follow-up.  Discussed this with patient, discussed return precautions, patient minimal with this plan.  Presentation is most consistent with acute complicated illness, Current presentation is complicated by underlying chronic conditions, and I did consider and rule out acute life/limb-threatening illness  Discussion of management or test interpretations with external provider(s): Not indicated  Risk Drugs:Prescription drug management and Parenteral controlled substances  Disposition: DISCHARGE: I believe that the patient is safe for discharge home with outpatient follow-up. Patient was informed of all pertinent physical exam, laboratory, and imaging findings. Patient's suspected etiology of their symptom presentation was discussed with the patient and all questions were answered. We discussed following up with PCP, urology. I provided thorough ED return precautions. The patient feels safe and comfortable with this plan.  MDM generated using voice dictation software and may contain dictation errors.  Please contact me for any clarification or with any  questions.  Clinical Impression:  1. Cystitis      Discharge   Final Clinical Impression(s) / ED Diagnoses Final diagnoses:  Cystitis    Rx / DC Orders ED Discharge Orders          Ordered    cephALEXin  (KEFLEX ) 500 MG capsule  4 times daily        10/27/24 0717    phenazopyridine  (PYRIDIUM ) 200 MG tablet  3 times daily        10/27/24 0717             Rogelia Jerilynn RAMAN, MD 10/28/24 [phone]

## 2024-10-27 NOTE — ED Triage Notes (Signed)
 Pt reports with lower abdominal pain and states that she has ovarian cysts that have ruptured earlier this month.

## 2024-10-27 NOTE — Discharge Instructions (Signed)
 Tricia Knox  Thank you for allowing us  to take care of you today.  You came to the Emergency Department today because having lower abdominal pain.  Previously you have been diagnosed with significant constipation, and you were recently diagnosed with ovarian cyst.  You had worsening pain today, therefore you came back to the emergency department.  Here in the emergency department we are not seeing evidence of ovarian cysts or twisting of your ovaries on your ultrasound, additionally we are not seen signs of appendicitis, or severe constipation on your CT.  We do see a moderate amount of stool, you would likely benefit from continuing to use laxatives such as MiraLAX.  We do see some inflammation of your bladder on the CT, as well as some inflammatory markers in your urine therefore we are concerned that you have a urinary tract infection.  We will give you a prescription for Keflex , a antibiotic that you will take 4 times a day for the next 5 days, as well as Pyridium , which is a medication that will help decrease bladder related pain, that you should take 3 times a day for the next 2 days.  Additionally will give you a referral to follow-up with urology.  To-Do: 1. Please follow-up with your primary doctor within 1 - 2 weeks / as soon as possible.   Please return to the Emergency Department or call 911 if you experience have worsening of your symptoms, or do not get better, chest pain, shortness of breath, severe or significantly worsening pain, high fever, severe confusion, pass out or have any reason to think that you need emergency medical care.   We hope you feel better soon.   Mitzie Later, MD Department of Emergency Medicine [facility] [place]

## 2024-10-29 LAB — URINE CULTURE: Culture: >=100000 — AB

## 2024-10-30 ENCOUNTER — Telehealth (HOSPITAL_BASED_OUTPATIENT_CLINIC_OR_DEPARTMENT_OTHER): Payer: Self-pay | Admitting: *Deleted

## 2024-10-30 NOTE — Telephone Encounter (Signed)
 Post ED Visit - Positive Culture Follow-up  Culture report reviewed by antimicrobial stewardship pharmacist: [facility] []  Rankin Dee, Pharm.D. []  Venetia Gully, Pharm.D., BCPS AQ-ID []  Garrel Crews, Pharm.D., BCPS []  Almarie Lunger, Pharm.D., BCPS []  [place], [address].D., BCPS, AAHIVP []  Rosaline Bihari, Pharm.D., BCPS, AAHIVP []  Vernell Meier, PharmD, BCPS []  Latanya Hint, PharmD, BCPS []  Donald Medley, PharmD, BCPS []  Rocky Bold, PharmD []  Dorothyann Alert, PharmD, BCPS []  Morene Babe, PharmD  [facility] [x]  Damien Quiet, PharmD []  Romona Bliss, PharmD []  Dolphus Roller, PharmD []  Veva Seip, Rph []  Vernell Daunt) Leonce, PharmD []  Eva Allis, PharmD []  Rosaline Millet, PharmD []  Iantha Batch, PharmD []  Arvin Gauss, PharmD []  Wanda Hasting, PharmD []  Ronal Rav, PharmD []  Rocky Slade, PharmD []  Bard Jeans, PharmD   Positive urine culture Treated with cephalexin , organism sensitive to the same and no further patient follow-up is required at this time.  Jama Wyman Kipper 10/30/2024, 9:52 AM

## 2024-11-09 ENCOUNTER — Encounter (HOSPITAL_BASED_OUTPATIENT_CLINIC_OR_DEPARTMENT_OTHER): Payer: Self-pay

## 2024-11-09 ENCOUNTER — Emergency Department (HOSPITAL_BASED_OUTPATIENT_CLINIC_OR_DEPARTMENT_OTHER)

## 2024-11-09 ENCOUNTER — Observation Stay (HOSPITAL_BASED_OUTPATIENT_CLINIC_OR_DEPARTMENT_OTHER)
Admission: EM | Admit: 2024-11-09 | Discharge: 2024-11-12 | Disposition: A | Discharge status: Home / Self Care | Attending: Internal Medicine | Admitting: Internal Medicine

## 2024-11-09 ENCOUNTER — Other Ambulatory Visit: Payer: Self-pay

## 2024-11-09 DIAGNOSIS — N10 Acute tubulo-interstitial nephritis: Secondary | ICD-10-CM | POA: Insufficient documentation

## 2024-11-09 DIAGNOSIS — R10A1 Flank pain, right side: Secondary | ICD-10-CM | POA: Diagnosis present

## 2024-11-09 DIAGNOSIS — E871 Hypo-osmolality and hyponatremia: Secondary | ICD-10-CM | POA: Insufficient documentation

## 2024-11-09 DIAGNOSIS — R519 Headache, unspecified: Secondary | ICD-10-CM | POA: Diagnosis not present

## 2024-11-09 DIAGNOSIS — N3000 Acute cystitis without hematuria: Secondary | ICD-10-CM | POA: Diagnosis not present

## 2024-11-09 DIAGNOSIS — N12 Tubulo-interstitial nephritis, not specified as acute or chronic: Principal | ICD-10-CM

## 2024-11-09 DIAGNOSIS — R7981 Abnormal blood-gas level: Secondary | ICD-10-CM | POA: Diagnosis present

## 2024-11-09 DIAGNOSIS — R0689 Other abnormalities of breathing: Secondary | ICD-10-CM | POA: Diagnosis not present

## 2024-11-09 DIAGNOSIS — D649 Anemia, unspecified: Secondary | ICD-10-CM | POA: Insufficient documentation

## 2024-11-09 LAB — CBC
HCT: 34.3 % — ABNORMAL LOW (ref 36.0–46.0)
Hemoglobin: 11.7 g/dL — ABNORMAL LOW (ref 12.0–15.0)
MCH: 29.8 pg (ref 26.0–34.0)
MCHC: 34.1 g/dL (ref 30.0–36.0)
MCV: 87.3 fL (ref 80.0–100.0)
Platelets: 306 K/uL (ref 150–400)
RBC: 3.93 MIL/uL (ref 3.87–5.11)
RDW: 11.9 % (ref 11.5–15.5)
WBC: 8.9 K/uL (ref 4.0–10.5)
nRBC: 0 % (ref 0.0–0.2)

## 2024-11-09 LAB — COMPREHENSIVE METABOLIC PANEL WITH GFR
ALT: 10 U/L (ref 0–44)
AST: 19 U/L (ref 15–41)
Albumin: 4.1 g/dL (ref 3.5–5.0)
Alkaline Phosphatase: 72 U/L (ref 38–126)
Anion gap: 14 (ref 5–15)
BUN: <5 mg/dL — ABNORMAL LOW (ref 6–20)
CO2: 21 mmol/L — ABNORMAL LOW (ref 22–32)
Calcium: 9.1 mg/dL (ref 8.9–10.3)
Chloride: 99 mmol/L (ref 98–111)
Creatinine, Ser: 0.69 mg/dL (ref 0.44–1.00)
GFR, Estimated: >60 mL/min
Glucose, Bld: 113 mg/dL — ABNORMAL HIGH (ref 70–99)
Potassium: 3.6 mmol/L (ref 3.5–5.1)
Sodium: 134 mmol/L — ABNORMAL LOW (ref 135–145)
Total Bilirubin: 0.3 mg/dL (ref 0.0–1.2)
Total Protein: 7.1 g/dL (ref 6.5–8.1)

## 2024-11-09 LAB — URINALYSIS, ROUTINE W REFLEX MICROSCOPIC
Bilirubin Urine: NEGATIVE
Glucose, UA: NEGATIVE mg/dL
Ketones, ur: NEGATIVE mg/dL
Nitrite: POSITIVE — AB
Protein, ur: 30 mg/dL — AB
Specific Gravity, Urine: 1.02 (ref 1.005–1.030)
pH: 7.5 (ref 5.0–8.0)

## 2024-11-09 LAB — URINALYSIS, MICROSCOPIC (REFLEX)

## 2024-11-09 LAB — RESP PANEL BY RT-PCR (RSV, FLU A&B, COVID)  RVPGX2
Influenza A by PCR: NEGATIVE
Influenza B by PCR: NEGATIVE
Resp Syncytial Virus by PCR: NEGATIVE
SARS Coronavirus 2 by RT PCR: NEGATIVE

## 2024-11-09 LAB — PROTIME-INR
INR: 1 (ref 0.8–1.2)
Prothrombin Time: 13.5 s (ref 11.4–15.2)

## 2024-11-09 LAB — HCG, SERUM, QUALITATIVE: Preg, Serum: NEGATIVE

## 2024-11-09 LAB — LACTIC ACID, PLASMA: Lactic Acid, Venous: 1.1 mmol/L (ref 0.5–1.9)

## 2024-11-09 LAB — LIPASE, BLOOD: Lipase: 16 U/L (ref 11–51)

## 2024-11-09 MED ORDER — SODIUM CHLORIDE 0.9 % IV SOLN
1.0000 g | Freq: Once | INTRAVENOUS | Status: AC
  Administered 2024-11-09: 1 g via INTRAVENOUS
  Filled 2024-11-09: qty 10

## 2024-11-09 MED ORDER — ONDANSETRON HCL 4 MG/2ML IJ SOLN
4.0000 mg | Freq: Once | INTRAMUSCULAR | Status: AC
  Administered 2024-11-09: 4 mg via INTRAVENOUS
  Filled 2024-11-09: qty 2

## 2024-11-09 MED ORDER — ACETAMINOPHEN 325 MG PO TABS
650.0000 mg | ORAL_TABLET | Freq: Once | ORAL | Status: AC | PRN
  Administered 2024-11-09: 650 mg via ORAL
  Filled 2024-11-09: qty 2

## 2024-11-09 MED ORDER — LACTATED RINGERS IV BOLUS
1000.0000 mL | Freq: Once | INTRAVENOUS | Status: AC
  Administered 2024-11-09: 1000 mL via INTRAVENOUS

## 2024-11-09 MED ORDER — FENTANYL CITRATE (PF) 50 MCG/ML IJ SOSY
50.0000 ug | PREFILLED_SYRINGE | INTRAMUSCULAR | Status: DC | PRN
  Administered 2024-11-09 – 2024-11-10 (×2): 50 ug via INTRAVENOUS
  Filled 2024-11-09 (×2): qty 1

## 2024-11-09 MED ORDER — KETOROLAC TROMETHAMINE 15 MG/ML IJ SOLN
30.0000 mg | Freq: Once | INTRAMUSCULAR | Status: AC
  Administered 2024-11-09: 30 mg via INTRAVENOUS
  Filled 2024-11-09: qty 2

## 2024-11-09 NOTE — ED Provider Notes (Signed)
 " [place] [facility] Provider Note   CSN: [id number] Arrival date & time: 11/09/24  2021     History Chief Complaint  Patient presents with   Abdominal Pain    HPI Tricia Knox is a 23 y.o. female presenting for chief complaint of abdominal pain. States that it started earlier this month with lower back and stomach pain.  Diagnosed with UTI and started on keflex   Patient's recorded medical, surgical, social, medication list and allergies were reviewed in the Snapshot window as part of the initial history.   Review of Systems   Review of Systems  Physical Exam Updated Vital Signs BP 106/70 (BP Location: Right Arm)   Pulse 92   Temp (!) 102.3 F (39.1 C) (Oral)   Resp 20   Ht 5' 5 (1.651 m)   Wt 63.5 kg   LMP 10/25/2024 (Approximate)   SpO2 100%   BMI 23.30 kg/m  Physical Exam   ED Course/ Medical Decision Making/ A&P    Procedures Procedures   Medications Ordered in ED Medications  acetaminophen  (TYLENOL ) tablet 650 mg (650 mg Oral Given 11/09/24 2046)    Medical Decision Making:   Tricia Knox is a 23 y.o. female who presented to the ED today with *** detailed above.    {crccomplexity:27900} Complete initial physical exam performed, notably the patient  was ***.    Reviewed and confirmed nursing documentation for past medical history, family history, social history.    Initial Assessment:   With the patient's presentation of ***, most likely diagnosis is ***. Other diagnoses were considered including (but not limited to) ***. These are considered less likely due to history of present illness and physical exam findings.   {crccopa:27899}  Initial Plan:  ***  ***Screening labs including CBC and Metabolic panel to evaluate for infectious or metabolic etiology of disease.  ***Urinalysis with reflex culture ordered to evaluate for UTI or relevant urologic/nephrologic pathology.  ***CXR to evaluate for  structural/infectious intrathoracic pathology.  {crccardiactesting:32591::EKG to evaluate for cardiac pathology} Objective evaluation as below reviewed   Initial Study Results:   Laboratory  All laboratory results reviewed without evidence of clinically relevant pathology.   ***Exceptions include: ***   ***EKG EKG was reviewed independently. Rate, rhythm, axis, intervals all examined and without medically relevant abnormality. ST segments without concerns for elevations.    Radiology:  All images reviewed independently. ***Agree with radiology report at this time.   DG Chest 2 View Result Date: 11/09/2024 EXAM: 2 VIEW(S) XRAY OF THE CHEST 11/09/2024 09:05:00 PM COMPARISON: None available. CLINICAL HISTORY: cough fever FINDINGS: LUNGS AND PLEURA: No focal pulmonary opacity. No pleural effusion. No pneumothorax. HEART AND MEDIASTINUM: No acute abnormality of the cardiac and mediastinal silhouettes. BONES AND SOFT TISSUES: No acute osseous abnormality. IMPRESSION: 1. No acute cardiopulmonary abnormality. Electronically signed by: Greig Pique MD 11/09/2024 09:44 PM EST RP Workstation: [id number]   CT ABDOMEN PELVIS W CONTRAST Result Date: 10/27/2024 EXAM: CT ABDOMEN AND PELVIS WITH CONTRAST 10/27/2024 05:22:49 AM TECHNIQUE: CT of the abdomen and pelvis was performed with the administration of 100 mL of iohexol  (OMNIPAQUE ) 300 MG/ML solution. Multiplanar reformatted images are provided for review. Automated exposure control, iterative reconstruction, and/or weight-based adjustment of the mA/kV was utilized to reduce the radiation dose to as low as reasonably achievable. COMPARISON: CT abdomen and pelvis 09/24/2024. Pelvis ultrasound with doppler reported separately today, 10/27/2024. CLINICAL HISTORY: 23 year old female with right lower quadrant pain for 1 week. FINDINGS:  LOWER CHEST: Visible lung bases are normal. LIVER: The liver is unremarkable. GALLBLADDER AND BILE DUCTS: Gallbladder is  unremarkable. No biliary ductal dilatation. SPLEEN: No acute abnormality. PANCREAS: No acute abnormality. ADRENAL GLANDS: No acute abnormality. KIDNEYS, URETERS AND BLADDER: Renal enhancement is symmetric and within normal limits. No stones in the kidneys or ureters. No hydronephrosis. No perinephric or periureteral stranding. The urinary bladder is decompressed but is diffusely thickened (sagittal image 89 and series 12 image 71) with mucosal hyperenhancement. No gas within the bladder. No additional regional inflammation. GI AND BOWEL: Nondilated stomach and small bowel. Redundant sigmoid colon in the lower abdomen. Mild to moderate large bowel retained gas and stool throughout. Redundant transverse colon. Normal gas containing retrocecal appendix on series 3 image 45. No large bowel inflammation. There is no bowel obstruction. PERITONEUM AND RETROPERITONEUM: No ascites. No free air. VASCULATURE: Portal venous system, major arterial structures in the abdomen and pelvis appear patent and normal. Aorta is normal in caliber. LYMPH NODES: No lymphadenopathy. REPRODUCTIVE ORGANS: Incidental tampon in place. Uterus and adnexa appear stable and within normal limits. See also pelvis ultrasound today. BONES AND SOFT TISSUES: Incidental umbilical piercing with mild streak artifact. No acute osseous abnormality. No focal soft tissue abnormality. IMPRESSION: 1. Bulky thickening of the urinary bladder wall with mucosal hyperenhancement. Consider acute UIT and/or cystitis. 2. Normal appendix.  Redundant large bowel with mild to moderate retained stool. Electronically signed by: Helayne Hurst MD 10/27/2024 05:33 AM EST RP Workstation: [id number]   US  PELVIC COMPLETE W TRANSVAGINAL AND TORSION R/O Result Date: 10/27/2024 EXAM: US  Pelvis, Complete Transvaginal and Transabdominal with Doppler TECHNIQUE: Transabdominal and transvaginal pelvic duplex ultrasound using B-mode/gray scaled imaging with Doppler spectral analysis and  color flow was obtained. COMPARISON: CT abdomen and pelvis 10/27/2024 reported separately. Pelvis ultrasound 02/25/2024. CLINICAL HISTORY: 23 year old female with 1 week of pelvic pain. FINDINGS: UTERUS: Uterus measures 8.5 x 4.9 x 5.9 cm. Estimated volume 129 ml. Uterus demonstrates normal myometrial echotexture. ENDOMETRIAL STRIPE: Endometrium measures 6 mm and appears normal. RIGHT OVARY: Right ovary measures 3.4 x 2.4 x 2.3 cm. Estimated volume 10 ml. Small but increased follicles compared to 02/25/2024, etiology and significance unclear. Otherwise appears normal. There is normal color and spectral Doppler vascularity. LEFT OVARY: Left ovary measures 2.9 x 2.4 x 3.7 cm. Estimated volume 14 ml. Small but increased follicles compared to 02/25/2024, etiology and significance unclear. Otherwise appears normal. There is normal color and spectral Doppler vascularity. FREE FLUID: Trace simple appearing free fluid in the cul de sac (series 1 image 25), appears physiologic. IMPRESSION: 1. Negative for ovarian torsion. Normal uterus and endometrium. Electronically signed by: Helayne Hurst MD 10/27/2024 05:28 AM EST RP Workstation: [id number]      Consults: Case discussed with ***.   Reassessment and Plan:   ***    ***  Clinical Impression: No diagnosis found.   Data Unavailable   Final Clinical Impression(s) / ED Diagnoses Final diagnoses:  None    Rx / DC Orders ED Discharge Orders     None       "

## 2024-11-09 NOTE — ED Triage Notes (Signed)
 Pt reports RLQ and LLQ abd pain that radiates to bilateral flank area, onset at the beginning of the month. + nausea and vomiting, no diarrhea. + cough as well, temp was 102.5 in triage. Denies abnormal urinary symptoms.

## 2024-11-10 ENCOUNTER — Encounter (HOSPITAL_COMMUNITY): Payer: Self-pay | Admitting: Internal Medicine

## 2024-11-10 DIAGNOSIS — Z743 Need for continuous supervision: Secondary | ICD-10-CM | POA: Diagnosis not present

## 2024-11-10 DIAGNOSIS — D649 Anemia, unspecified: Secondary | ICD-10-CM | POA: Diagnosis present

## 2024-11-10 DIAGNOSIS — N3000 Acute cystitis without hematuria: Secondary | ICD-10-CM | POA: Diagnosis present

## 2024-11-10 DIAGNOSIS — R519 Headache, unspecified: Secondary | ICD-10-CM | POA: Diagnosis present

## 2024-11-10 DIAGNOSIS — R7981 Abnormal blood-gas level: Secondary | ICD-10-CM | POA: Diagnosis present

## 2024-11-10 DIAGNOSIS — R10A1 Flank pain, right side: Secondary | ICD-10-CM | POA: Diagnosis present

## 2024-11-10 DIAGNOSIS — R1084 Generalized abdominal pain: Secondary | ICD-10-CM | POA: Diagnosis not present

## 2024-11-10 DIAGNOSIS — E871 Hypo-osmolality and hyponatremia: Secondary | ICD-10-CM | POA: Diagnosis present

## 2024-11-10 DIAGNOSIS — N12 Tubulo-interstitial nephritis, not specified as acute or chronic: Secondary | ICD-10-CM | POA: Diagnosis present

## 2024-11-10 LAB — CBC
HCT: 34.2 % — ABNORMAL LOW (ref 36.0–46.0)
Hemoglobin: 11.6 g/dL — ABNORMAL LOW (ref 12.0–15.0)
MCH: 30.4 pg (ref 26.0–34.0)
MCHC: 33.9 g/dL (ref 30.0–36.0)
MCV: 89.8 fL (ref 80.0–100.0)
Platelets: 300 K/uL (ref 150–400)
RBC: 3.81 MIL/uL — ABNORMAL LOW (ref 3.87–5.11)
RDW: 12.2 % (ref 11.5–15.5)
WBC: 9.3 K/uL (ref 4.0–10.5)
nRBC: 0 % (ref 0.0–0.2)

## 2024-11-10 LAB — BASIC METABOLIC PANEL WITH GFR
Anion gap: 11 (ref 5–15)
BUN: 6 mg/dL (ref 6–20)
CO2: 25 mmol/L (ref 22–32)
Calcium: 9.3 mg/dL (ref 8.9–10.3)
Chloride: 100 mmol/L (ref 98–111)
Creatinine, Ser: 0.81 mg/dL (ref 0.44–1.00)
GFR, Estimated: >60 mL/min
Glucose, Bld: 92 mg/dL (ref 70–99)
Potassium: 3.6 mmol/L (ref 3.5–5.1)
Sodium: 136 mmol/L (ref 135–145)

## 2024-11-10 LAB — HIV ANTIBODY (ROUTINE TESTING W REFLEX): HIV Screen 4th Generation wRfx: NONREACTIVE

## 2024-11-10 LAB — MAGNESIUM: Magnesium: 2 mg/dL (ref 1.7–2.4)

## 2024-11-10 MED ORDER — SODIUM CHLORIDE 0.9 % IV SOLN
1.0000 g | INTRAVENOUS | Status: DC
  Administered 2024-11-10 – 2024-11-11 (×2): 1 g via INTRAVENOUS
  Filled 2024-11-10 (×2): qty 10

## 2024-11-10 MED ORDER — ONDANSETRON HCL 4 MG/2ML IJ SOLN: 4.0000 mg | Freq: Four times a day (QID) | INTRAMUSCULAR | Status: DC | PRN

## 2024-11-10 MED ORDER — ONDANSETRON HCL 4 MG PO TABS: 4.0000 mg | ORAL_TABLET | Freq: Four times a day (QID) | ORAL | Status: DC | PRN

## 2024-11-10 MED ORDER — ACETAMINOPHEN 325 MG PO TABS
650.0000 mg | ORAL_TABLET | Freq: Four times a day (QID) | ORAL | Status: DC | PRN
  Administered 2024-11-10 – 2024-11-11 (×3): 650 mg via ORAL
  Filled 2024-11-10 (×3): qty 2

## 2024-11-10 MED ORDER — OXYCODONE HCL 5 MG PO TABS
5.0000 mg | ORAL_TABLET | ORAL | Status: DC | PRN
  Administered 2024-11-10 – 2024-11-12 (×10): 5 mg via ORAL
  Filled 2024-11-10 (×8): qty 1

## 2024-11-10 MED ORDER — ENOXAPARIN SODIUM 40 MG/0.4ML IJ SOSY
40.0000 mg | PREFILLED_SYRINGE | INTRAMUSCULAR | Status: DC
  Administered 2024-11-10: 40 mg via SUBCUTANEOUS
  Filled 2024-11-10: qty 0.4

## 2024-11-10 MED ORDER — ACETAMINOPHEN 650 MG RE SUPP: 650.0000 mg | Freq: Four times a day (QID) | RECTAL | Status: DC | PRN

## 2024-11-10 MED ORDER — SODIUM CHLORIDE 0.9 % IV SOLN: INTRAVENOUS | Status: AC

## 2024-11-10 MED ORDER — KETOROLAC TROMETHAMINE 15 MG/ML IJ SOLN
15.0000 mg | Freq: Four times a day (QID) | INTRAMUSCULAR | Status: DC
  Administered 2024-11-10 – 2024-11-12 (×10): 15 mg via INTRAVENOUS
  Filled 2024-11-10 (×8): qty 1

## 2024-11-10 MED ORDER — SODIUM CHLORIDE 0.9 % IV BOLUS: 1000.0000 mL | Freq: Once | INTRAVENOUS | Status: DC

## 2024-11-10 NOTE — ED Notes (Signed)
 [facility] called for Transport to [facility] @7 :29.  Spoke with Zachary

## 2024-11-10 NOTE — H&P (Signed)
 " History and Physical    Patient: Tricia Knox FMW:[id number] DOB: 2001-07-04 DOA: 11/09/2024 DOS: the patient was seen and examined on 11/10/2024 PCP: Patient, No Pcp Per  Patient coming from: Home  Chief Complaint:  Chief Complaint  Patient presents with   Abdominal Pain   HPI: Tricia Knox is a 23 y.o. female with medical history significant of right ear foreign body, right ear tympanic membrane granuloma, unspecified knee injury who presented to the emergency department with complaints of bilateral abdominal lower quadrant, suprapubic and right flank pain associated with nausea, vomiting, decreased appetite, chills and fever.  She denied rhinorrhea, sore throat, wheezing or hemoptysis.  No chest pain, palpitations, diaphoresis, PND, orthopnea or pitting edema of the lower extremities.  No abdominal pain, diarrhea, constipation, melena or hematochezia.  No polyuria, polydipsia, polyphagia or blurred vision.   Lab work: Urinalysis was hazy in appearance, with trace hemoglobin, protein 30 mg/dL, positive nitrites, positive small leukocyte esterase, 11-20 WBC, 0-5 RBC and many bacteria microscopic examination.  Serum pregnancy test was negative normal lipase, lactic acid and magnesium level.  CMP showed sodium 134 and CO2 of 21 mmol/L with a normal anion gap.  Glucose 113 and BUN less than 5 mg/dL, the rest of the electrolytes, renal function and hepatic functions were normal.  Imaging: CT renal stone study with no nephrolithiasis, obstructive uropathy or renal mass.  There was bladder wall thickening and perivesical stranding consistent with cystitis.  Similar findings seen on the prior 2 studies.  ED course: Initial vital signs were temperature 102.5 F, pulse 110, respiration 18, BP 104/80 mmHg and O2 sat 99% on room air.  The patient received acetaminophen  650 mg p.o. x 1, ceftriaxone  1 g IVPB x 1, LR 1000 mL liter bolus x 1 and Zofran  4 mg IVP x 1.   Review of Systems: As mentioned in the  history of present illness. All other systems reviewed and are negative. Past Medical History:  Diagnosis Date   Knee injury    History reviewed. No pertinent surgical history. Social History:  reports that she has never smoked. She has never used smokeless tobacco. She reports that she does not drink alcohol and does not use drugs.  Allergies[1]  History reviewed. No pertinent family history.  Prior to Admission medications  Medication Sig Start Date End Date Taking? Authorizing Provider  cefadroxil  (DURICEF) 500 MG capsule Take 1 capsule (500 mg total) by mouth 2 (two) times daily. 03/13/24   Smoot, Lauraine LABOR, PA-C  cephALEXin  (KEFLEX ) 500 MG capsule Take 1 capsule (500 mg total) by mouth 4 (four) times daily. 10/27/24   Rogelia Jerilynn RAMAN, MD  fluconazole  (DIFLUCAN ) 150 MG tablet Take 1 tablet as needed for vaginal yeast infection.  May repeat in 3 days if symptoms persist. Patient not taking: Reported on 03/25/2023 06/06/21   Molpus, Norleen, MD  HYDROcodone -acetaminophen  (NORCO) 5-325 MG tablet Take 2 tablets by mouth every 4 (four) hours as needed for severe pain. Patient not taking: Reported on 03/25/2023 06/06/21   Molpus, Norleen, MD  ibuprofen  (ADVIL ) 800 MG tablet Take 1 tablet (800 mg total) by mouth every 8 (eight) hours as needed. Patient not taking: Reported on 03/25/2023 04/19/22   Long, Fonda MATSU, MD  ketorolac  (TORADOL ) 10 MG tablet Take 1 tablet (10 mg total) by mouth every 6 (six) hours as needed for moderate pain (pain score 4-6). 09/25/24   Zelaya, Oscar A, PA-C  methocarbamol  (ROBAXIN ) 500 MG tablet Take 1 tablet (500 mg total) by  mouth every 8 (eight) hours as needed for muscle spasms. Patient not taking: Reported on 03/25/2023 04/19/22   Long, Fonda MATSU, MD  naproxen  (NAPROSYN ) 500 MG tablet Take 1 tablet (500 mg total) by mouth 2 (two) times daily. Patient not taking: Reported on 03/25/2023 11/26/21   Theadore Ozell HERO, MD  neomycin -polymyxin-hydrocortisone (CORTISPORIN) OTIC solution  Apply one to two drops to toe daily before dressing Patient not taking: Reported on 03/25/2023 06/08/21   Hyatt, Max T, DPM  ondansetron  (ZOFRAN -ODT) 4 MG disintegrating tablet Take 1 tablet (4 mg total) by mouth every 8 (eight) hours as needed. 03/13/24   Smoot, Sarah A, PA-C  phenazopyridine  (PYRIDIUM ) 200 MG tablet Take 1 tablet (200 mg total) by mouth 3 (three) times daily. 10/27/24   Rogelia Jerilynn RAMAN, MD    Physical Exam: Vitals:   11/10/24 0600 11/10/24 0615 11/10/24 0645 11/10/24 0908  BP: (!) 99/56 (!) 93/48 (!) 101/51 105/62  Pulse: 80 85 87 99  Resp:    20  Temp:    (!) 102 F (38.9 C)  TempSrc:    Oral  SpO2: 100% 100% 97% 100%  Weight:      Height:       Physical Exam Vitals and nursing note reviewed.  Constitutional:      General: She is awake. She is not in acute distress.    Appearance: She is well-developed. She is ill-appearing.  HENT:     Head: Normocephalic.     Nose: No rhinorrhea.     Mouth/Throat:     Mouth: Mucous membranes are dry.  Eyes:     General: No scleral icterus.    Pupils: Pupils are equal, round, and reactive to light.  Neck:     Vascular: No JVD.  Cardiovascular:     Rate and Rhythm: Normal rate and regular rhythm.     Heart sounds: S1 normal and S2 normal.  Pulmonary:     Effort: Pulmonary effort is normal.     Breath sounds: Normal breath sounds. No wheezing, rhonchi or rales.  Abdominal:     General: There is no distension.     Palpations: Abdomen is soft.     Tenderness: There is abdominal tenderness in the right upper quadrant, right lower quadrant, suprapubic area and left lower quadrant. There is right CVA tenderness. There is no left CVA tenderness.  Musculoskeletal:     Cervical back: Neck supple.     Right lower leg: No edema.     Left lower leg: No edema.  Skin:    General: Skin is warm and dry.  Neurological:     General: No focal deficit present.     Mental Status: She is alert and oriented to person, place, and time.   Psychiatric:        Mood and Affect: Mood normal.        Behavior: Behavior normal. Behavior is cooperative.     Data Reviewed:  Results are pending, will review when available.  Assessment and Plan: Principal Problem:   Right flank pain In the setting of:   Acute cystitis Observation/MedSurg. Continue IV fluids. Keep n.p.o. for now. Analgesics as needed. Antiemetics as needed. Pantoprazole 40 mg IVP daily. Follow CBC, CMP and lipase in AM.  Active Problems:   Headache  In the setting of acute illness/poor sleep recently. Continue acetaminophen , ketorolac  and oxycodone  as needed.    Normocytic anemia Frequently heavy manses. Advised to supplement with iron. Advised to establish with PCP.  Hyponatremia Due to N/V. Resolved.    Hypocarbia Likely from hyperventilation. He has already resolved.     Advance Care Planning:   Code Status: Full Code   Consults:   Family Communication:   Severity of Illness: The appropriate patient status for this patient is INPATIENT. Inpatient status is judged to be reasonable and necessary in order to provide the required intensity of service to ensure the patient's safety. The patient's presenting symptoms, physical exam findings, and initial radiographic and laboratory data in the context of their chronic comorbidities is felt to place them at high risk for further clinical deterioration. Furthermore, it is not anticipated that the patient will be medically stable for discharge from the hospital within 2 midnights of admission.   * I certify that at the point of admission it is my clinical judgment that the patient will require inpatient hospital care spanning beyond 2 midnights from the point of admission due to high intensity of service, high risk for further deterioration and high frequency of surveillance required.*  Author: Alm Dorn Castor, MD 11/10/2024 9:09 AM  For on call review [email or web address].   This document was  prepared using Dragon voice recognition software and may contain some unintended transcription errors.     [1] No Known Allergies  "

## 2024-11-10 NOTE — Plan of Care (Signed)
 ?  Problem: Clinical Measurements: ?Goal: Diagnostic test results will improve ?Outcome: Progressing ?  ?Problem: Safety: ?Goal: Ability to remain free from injury will improve ?Outcome: Progressing ?  ?

## 2024-11-11 DIAGNOSIS — E871 Hypo-osmolality and hyponatremia: Secondary | ICD-10-CM

## 2024-11-11 DIAGNOSIS — R10A1 Flank pain, right side: Secondary | ICD-10-CM

## 2024-11-11 MED ORDER — SENNOSIDES-DOCUSATE SODIUM 8.6-50 MG PO TABS
1.0000 | ORAL_TABLET | Freq: Two times a day (BID) | ORAL | Status: DC
  Administered 2024-11-11 – 2024-11-12 (×3): 1 via ORAL
  Filled 2024-11-11 (×3): qty 1

## 2024-11-11 NOTE — Progress Notes (Signed)
 Consultation Progress Note   Patient: Tricia Knox FMW:[id number] DOB: [birth year] DOA: 11/09/2024 DOS: the patient was seen and examined on 11/11/2024 Primary service: Chania Kochanski, Landon BRAVO, MD  Brief hospital course: 23-year-old female who presented with acute cystitis, history of failed outpatient treatment.  Previous cultures grew E. coli.  Patient was treated with Pyridium  and cephalexin  at that time.  Assessment and Plan: Right flank pain Acute cystitis Presented with bilateral lower abdominal pain, suprapubic and right flank discomfort with nausea vomiting chills and a fever.  UA was concerning for urinary tract infection CT imaging showed bladder wall thickening and perivesical stranding concerning with cystitis.  Patient was febrile, tachycardic and she received IV fluids, Rocephin  and antiemetics. Of note she failed outpatient treatment with cephalexin  and Pyridium . Follow-up urine cultures. Patient is not medically stable for discharge yet. Tmax of 102, no leukocytosis. He continues to have lower back pain.      Headache  In the setting of acute illness/poor sleep recently. Continue acetaminophen , ketorolac  and oxycodone  as needed.    Normocytic anemia Frequently heavy manses. Advised to supplement with iron. Advised to establish with PCP.     Hyponatremia Resolved.         [facility] will continue to follow the patient.  Subjective: Patient seen at bedside this morning, her grandmother at the bedside.  She complained of lower back pain and headaches.  She still feels lethargic but improved compared to time of admission.  Physical Exam: Vitals:   11/10/24 1334 11/10/24 1858 11/10/24 2117 11/11/24 0535  BP: 104/60 100/74 (!) 103/53 107/61  Pulse: 80 85 81 71  Resp: 18 18 16 12   Temp: 98.9 F (37.2 C) 98.1 F (36.7 C) 99.6 F (37.6 C) 98.6 F (37 C)  TempSrc: Oral Oral Oral Oral  SpO2: 98% 100% 100% 97%  Weight:      Height:       General  No acute Distress Eyes:  PERRL, lids and conjunctivae normal ENMT: Mucous membranes are moist.   Neck: normal, supple, no masses, no thyromegaly Respiratory: clear to auscultation bilaterally, no wheezing, no crackles. Normal respiratory effort. No accessory muscle use.  Cardiovascular: Regular rate and rhythm, no murmurs / rubs / gallops Abdomen: Soft, nontender nondistended. Musculoskeletal: no clubbing / cyanosis. No joint deformity upper and lower extremities.  Skin: no rashes, lesions, ulcers. No induration Neurologic: Facial asymmetry, moving extremity spontaneously, speech fluent. Psychiatric: Normal judgment and insight. Alert and oriented x 3. Normal mood.   Data Reviewed:    CT UROGRAM 11/09/2024 10:31:00 PM   TECHNIQUE: CT of the abdomen and pelvis was performed before and after the administration of intravenous contrast as per CT urogram protocol. Multiplanar reformatted images as well as MIP urogram images are provided for review. Automated exposure control, iterative reconstruction, and/or weight based adjustment of the mA/kV was utilized to reduce the radiation dose to as low as reasonably achievable.   COMPARISON: CTs with contrast 10/27/2024 and 09/24/2024.   CLINICAL HISTORY: Right and left lower quadrant abdominal pain radiating to the bilateral flanks, ongoing since early this month, with occasional nausea and vomiting. Fever in triage.   FINDINGS:   LOWER CHEST: No acute abnormality.   LIVER: The liver is 19.5 cm in length, mildly steatotic. No focal abnormality is seen.   GALLBLADDER AND BILE DUCTS: Gallbladder is unremarkable. No biliary ductal dilatation.   SPLEEN: No acute abnormality.   PANCREAS: No acute abnormality.   ADRENAL GLANDS: There is no adrenal mass.   KIDNEYS,  URETERS AND BLADDER: There is no renal mass. No nephrolithiasis, hydronephrosis, or ureteral stones on the current or the prior studies.   There is a diffusely thickened bladder with  increased perivesical stranding concerning for cystitis. No perinephric or periureteral stranding.   GI AND BOWEL: Stomach demonstrates no acute abnormality. There is no bowel obstruction or inflammation. The appendix is normal.   PERITONEUM AND RETROPERITONEUM: Small amount of low-density fluid is again present in the pelvic cul-de-sac, which could be reactive or physiologic. There is no free hemorrhage, free air, or localizing encapsulated collections.   VASCULATURE: Aorta is normal in caliber.   LYMPH NODES: No lymphadenopathy.   REPRODUCTIVE ORGANS: No acute abnormality.   BONES AND SOFT TISSUES: No acute osseous abnormality. No focal soft tissue abnormality.   IMPRESSION: 1. No nephrolithiasis, obstructive uropathy, or renal mass. 2. Bladder wall thickening and perivesical stranding consistent with cystitis. Similar findings on the 2 prior studies.   CBC    Component Value Date/Time   WBC 9.3 11/10/2024 0858   RBC 3.81 (L) 11/10/2024 0858   HGB 11.6 (L) 11/10/2024 0858   HCT 34.2 (L) 11/10/2024 0858   PLT 300 11/10/2024 0858   MCV 89.8 11/10/2024 0858   MCH 30.4 11/10/2024 0858   MCHC 33.9 11/10/2024 0858   RDW 12.2 11/10/2024 0858   LYMPHSABS 1.3 10/27/2024 0336   MONOABS 0.9 10/27/2024 0336   EOSABS 0.0 10/27/2024 0336   BASOSABS 0.0 10/27/2024 0336   CMP     Component Value Date/Time   NA 136 11/10/2024 0858   K 3.6 11/10/2024 0858   CL 100 11/10/2024 0858   CO2 25 11/10/2024 0858   GLUCOSE 92 11/10/2024 0858   BUN 6 11/10/2024 0858   CREATININE 0.81 11/10/2024 0858   CALCIUM 9.3 11/10/2024 0858   PROT 7.1 11/09/2024 2043   ALBUMIN 4.1 11/09/2024 2043   AST 19 11/09/2024 2043   ALT 10 11/09/2024 2043   ALKPHOS 72 11/09/2024 2043   BILITOT 0.3 11/09/2024 2043   GFRNONAA >60 11/10/2024 0858    Family Communication: Updated grandmother at bedside  Time spent: 36 minutes.  Author: Landon FORBES Baller, MD 11/11/2024 9:02 AM  For on call review  [email or web address].

## 2024-11-11 NOTE — Plan of Care (Signed)

## 2024-11-11 NOTE — Plan of Care (Signed)

## 2024-11-12 ENCOUNTER — Observation Stay (HOSPITAL_COMMUNITY)

## 2024-11-12 DIAGNOSIS — N3000 Acute cystitis without hematuria: Secondary | ICD-10-CM | POA: Diagnosis not present

## 2024-11-12 DIAGNOSIS — E871 Hypo-osmolality and hyponatremia: Secondary | ICD-10-CM | POA: Diagnosis not present

## 2024-11-12 DIAGNOSIS — R10A1 Flank pain, right side: Secondary | ICD-10-CM | POA: Diagnosis not present

## 2024-11-12 LAB — COMPREHENSIVE METABOLIC PANEL WITH GFR
ALT: 34 U/L (ref 0–44)
AST: 36 U/L (ref 15–41)
Albumin: 3.8 g/dL (ref 3.5–5.0)
Alkaline Phosphatase: 88 U/L (ref 38–126)
Anion gap: 12 (ref 5–15)
BUN: <5 mg/dL — ABNORMAL LOW (ref 6–20)
CO2: 24 mmol/L (ref 22–32)
Calcium: 9.8 mg/dL (ref 8.9–10.3)
Chloride: 103 mmol/L (ref 98–111)
Creatinine, Ser: 0.6 mg/dL (ref 0.44–1.00)
GFR, Estimated: >60 mL/min
Glucose, Bld: 87 mg/dL (ref 70–99)
Potassium: 4 mmol/L (ref 3.5–5.1)
Sodium: 139 mmol/L (ref 135–145)
Total Bilirubin: 0.2 mg/dL (ref 0.0–1.2)
Total Protein: 7.1 g/dL (ref 6.5–8.1)

## 2024-11-12 LAB — CBC
HCT: 32.2 % — ABNORMAL LOW (ref 36.0–46.0)
Hemoglobin: 11 g/dL — ABNORMAL LOW (ref 12.0–15.0)
MCH: 30.3 pg (ref 26.0–34.0)
MCHC: 34.2 g/dL (ref 30.0–36.0)
MCV: 88.7 fL (ref 80.0–100.0)
Platelets: 314 K/uL (ref 150–400)
RBC: 3.63 MIL/uL — ABNORMAL LOW (ref 3.87–5.11)
RDW: 12 % (ref 11.5–15.5)
WBC: 4.5 K/uL (ref 4.0–10.5)
nRBC: 0 % (ref 0.0–0.2)

## 2024-11-12 LAB — URINE CULTURE: Culture: >=100000 — AB

## 2024-11-12 MED ORDER — CYCLOBENZAPRINE HCL 5 MG PO TABS: 5.0000 mg | ORAL_TABLET | Freq: Three times a day (TID) | ORAL | 0 refills | Status: AC | PRN

## 2024-11-12 MED ORDER — POLYETHYLENE GLYCOL 3350 17 G PO PACK
17.0000 g | PACK | Freq: Every day | ORAL | Status: DC
  Administered 2024-11-12: 17 g via ORAL
  Filled 2024-11-12: qty 1

## 2024-11-12 MED ORDER — CYCLOBENZAPRINE HCL 5 MG PO TABS
5.0000 mg | ORAL_TABLET | Freq: Three times a day (TID) | ORAL | Status: DC
  Administered 2024-11-12 (×2): 5 mg via ORAL
  Filled 2024-11-12 (×2): qty 1

## 2024-11-12 MED ORDER — CIPROFLOXACIN HCL 500 MG PO TABS: 500.0000 mg | ORAL_TABLET | Freq: Two times a day (BID) | ORAL | 0 refills | Status: AC

## 2024-11-12 NOTE — Plan of Care (Signed)
  Problem: Education: Goal: Knowledge of General Education information will improve Description: Including pain rating scale, medication(s)/side effects and non-pharmacologic comfort measures Outcome: Progressing   Problem: Health Behavior/Discharge Planning: Goal: Ability to manage health-related needs will improve Outcome: Progressing   Problem: Clinical Measurements: Goal: Ability to maintain clinical measurements within normal limits will improve Outcome: Progressing Goal: Will remain free from infection Outcome: Progressing Goal: Diagnostic test results will improve Outcome: Progressing Goal: Respiratory complications will improve Outcome: Progressing Goal: Cardiovascular complication will be avoided Outcome: Progressing   Problem: Elimination: Goal: Will not experience complications related to bowel motility Outcome: Progressing Goal: Will not experience complications related to urinary retention Outcome: Progressing   Problem: Pain Managment: Goal: General experience of comfort will improve and/or be controlled Outcome: Progressing   Problem: Skin Integrity: Goal: Risk for impaired skin integrity will decrease Outcome: Progressing   Problem: Safety: Goal: Ability to remain free from injury will improve Outcome: Progressing

## 2024-11-12 NOTE — Final Progress Note (Signed)
 IV removed. AVS reviewed with patient whom verbalized understanding and denies further needs.

## 2024-11-12 NOTE — Progress Notes (Signed)
 Consultation Progress Note   Patient: Tricia Knox FMW:[id number] DOB: [birth year] DOA: 11/09/2024 DOS: the patient was seen and examined on 11/12/2024 Primary service: Akashdeep Chuba, Landon BRAVO, MD  Brief hospital course: 23 year old female who presented with acute cystitis, history of failed outpatient treatment.  Previous cultures grew E. coli.  Patient was treated with Pyridium  and cephalexin  at that time.  Assessment and Plan: Right flank pain Acute cystitis Presented with bilateral lower abdominal pain, suprapubic and right flank discomfort with nausea vomiting chills and a fever.  UA was concerning for urinary tract infection  CT imaging showed bladder wall thickening and perivesical stranding concerning with cystitis.  Patient was febrile, tachycardic and she received IV fluids, Rocephin  and antiemetics. Of note she failed outpatient treatment with cephalexin  and Pyridium . Follow-up urine cultures. Tmax of 102, no leukocytosis. She  continues to have lower back pain and headaches.      Headache  In the setting of acute illness/poor sleep recently. Continue acetaminophen , ketorolac  and oxycodone  as needed.    Normocytic anemia Frequently heavy manses. Advised to supplement with iron. Advised to establish with PCP.     Hyponatremia Resolved.         [facility] will continue to follow the patient.  Subjective: Patient seen at bedside this morning, her grandmother at the bedside.  She complained of upper  back pain and headaches.   She still feels lethargic but improved compared to time of admission.  Physical Exam: Vitals:   11/10/24 2117 11/11/24 0535 11/11/24 1405 11/11/24 2227  BP: (!) 103/53 107/61 120/79 105/69  Pulse: 81 71 78 69  Resp: 16 12 16 16   Temp: 99.6 F (37.6 C) 98.6 F (37 C) 98.2 F (36.8 C) 98.2 F (36.8 C)  TempSrc: Oral Oral Oral Oral  SpO2: 100% 97% 99% 100%  Weight:      Height:       General  No acute Distress Eyes: PERRL, lids and  conjunctivae normal ENMT: Mucous membranes are moist.   Neck: normal, supple, no masses, no thyromegaly Respiratory: clear to auscultation bilaterally, no wheezing, no crackles. Normal respiratory effort. No accessory muscle use.  Cardiovascular: Regular rate and rhythm, no murmurs / rubs / gallops Abdomen: Soft, nontender nondistended. Musculoskeletal: no clubbing / cyanosis. No joint deformity upper and lower extremities.  Skin: no rashes, lesions, ulcers. No induration Neurologic: Facial asymmetry, moving extremity spontaneously, speech fluent. Psychiatric: Normal judgment and insight. Alert and oriented x 3. Normal mood.   Data Reviewed: CBC    Component Value Date/Time   WBC 4.5 11/12/2024 1027   RBC 3.63 (L) 11/12/2024 1027   HGB 11.0 (L) 11/12/2024 1027   HCT 32.2 (L) 11/12/2024 1027   PLT 314 11/12/2024 1027   MCV 88.7 11/12/2024 1027   MCH 30.3 11/12/2024 1027   MCHC 34.2 11/12/2024 1027   RDW 12.0 11/12/2024 1027   LYMPHSABS 1.3 10/27/2024 0336   MONOABS 0.9 10/27/2024 0336   EOSABS 0.0 10/27/2024 0336   BASOSABS 0.0 10/27/2024 0336   CMP     Component Value Date/Time   NA 139 11/12/2024 1027   K 4.0 11/12/2024 1027   CL 103 11/12/2024 1027   CO2 24 11/12/2024 1027   GLUCOSE 87 11/12/2024 1027   BUN <5 (L) 11/12/2024 1027   CREATININE 0.60 11/12/2024 1027   CALCIUM 9.8 11/12/2024 1027   PROT 7.1 11/12/2024 1027   ALBUMIN 3.8 11/12/2024 1027   AST 36 11/12/2024 1027   ALT 34 11/12/2024 1027   ALKPHOS  88 11/12/2024 1027   BILITOT 0.2 11/12/2024 1027   GFRNONAA >60 11/12/2024 1027      CT UROGRAM 11/09/2024 10:31:00 PM   TECHNIQUE: CT of the abdomen and pelvis was performed before and after the administration of intravenous contrast as per CT urogram protocol. Multiplanar reformatted images as well as MIP urogram images are provided for review. Automated exposure control, iterative reconstruction, and/or weight based adjustment of the mA/kV was utilized  to reduce the radiation dose to as low as reasonably achievable.   COMPARISON: CTs with contrast 10/27/2024 and 09/24/2024.   CLINICAL HISTORY: Right and left lower quadrant abdominal pain radiating to the bilateral flanks, ongoing since early this month, with occasional nausea and vomiting. Fever in triage.   FINDINGS:   LOWER CHEST: No acute abnormality.   LIVER: The liver is 19.5 cm in length, mildly steatotic. No focal abnormality is seen.   GALLBLADDER AND BILE DUCTS: Gallbladder is unremarkable. No biliary ductal dilatation.   SPLEEN: No acute abnormality.   PANCREAS: No acute abnormality.   ADRENAL GLANDS: There is no adrenal mass.   KIDNEYS, URETERS AND BLADDER: There is no renal mass. No nephrolithiasis, hydronephrosis, or ureteral stones on the current or the prior studies.   There is a diffusely thickened bladder with increased perivesical stranding concerning for cystitis. No perinephric or periureteral stranding.   GI AND BOWEL: Stomach demonstrates no acute abnormality. There is no bowel obstruction or inflammation. The appendix is normal.   PERITONEUM AND RETROPERITONEUM: Small amount of low-density fluid is again present in the pelvic cul-de-sac, which could be reactive or physiologic. There is no free hemorrhage, free air, or localizing encapsulated collections.   VASCULATURE: Aorta is normal in caliber.   LYMPH NODES: No lymphadenopathy.   REPRODUCTIVE ORGANS: No acute abnormality.   BONES AND SOFT TISSUES: No acute osseous abnormality. No focal soft tissue abnormality.   IMPRESSION: 1. No nephrolithiasis, obstructive uropathy, or renal mass. 2. Bladder wall thickening and perivesical stranding consistent with cystitis. Similar findings on the 2 prior studies.   CBC    Component Value Date/Time   WBC 4.5 11/12/2024 1027   RBC 3.63 (L) 11/12/2024 1027   HGB 11.0 (L) 11/12/2024 1027   HCT 32.2 (L) 11/12/2024 1027   PLT 314  11/12/2024 1027   MCV 88.7 11/12/2024 1027   MCH 30.3 11/12/2024 1027   MCHC 34.2 11/12/2024 1027   RDW 12.0 11/12/2024 1027   LYMPHSABS 1.3 10/27/2024 0336   MONOABS 0.9 10/27/2024 0336   EOSABS 0.0 10/27/2024 0336   BASOSABS 0.0 10/27/2024 0336   CMP     Component Value Date/Time   NA 139 11/12/2024 1027   K 4.0 11/12/2024 1027   CL 103 11/12/2024 1027   CO2 24 11/12/2024 1027   GLUCOSE 87 11/12/2024 1027   BUN <5 (L) 11/12/2024 1027   CREATININE 0.60 11/12/2024 1027   CALCIUM 9.8 11/12/2024 1027   PROT 7.1 11/12/2024 1027   ALBUMIN 3.8 11/12/2024 1027   AST 36 11/12/2024 1027   ALT 34 11/12/2024 1027   ALKPHOS 88 11/12/2024 1027   BILITOT 0.2 11/12/2024 1027   GFRNONAA >60 11/12/2024 1027    Family Communication: Updated grandmother at bedside  Time spent: 36 minutes.  Author: Landon FORBES Baller, MD 11/12/2024 5:30 PM  For on call review [email or web address].

## 2024-11-12 NOTE — Plan of Care (Signed)

## 2024-11-12 NOTE — Discharge Summary (Signed)
 " Physician Discharge Summary   Patient: Tricia Knox MRN: [id number] DOB: Oct 11, 2001  Admit date:     11/09/2024  Discharge date: 11/12/2024  Discharge Physician: Landon BRAVO Yannet Rincon   PCP: Patient, No Pcp Per   Recommendations at discharge:    Follow up with PCP   Discharge Diagnoses: Principal Problem:   Acute cystitis Active Problems:   Normocytic anemia   Hyponatremia   Right flank pain   Headache  Resolved Problems:   * No resolved hospital problems. *  Hospital Course: 23 year old female who presented with acute cystitis, history of failed outpatient treatment.    See below for details of hospitalization.  Assessment and Plan: Right flank pain Acute cystitis Presented with bilateral lower abdominal pain, suprapubic and right flank discomfort with nausea vomiting chills and a fever.  UA was concerning for urinary tract infection  CT imaging showed bladder wall thickening and perivesical stranding concerning with cystitis.  Patient was febrile, tachycardic and she received IV fluids, Rocephin  and antiemetics. Her  presenting symptoms improved, Ucx  grew E,coli which was sensitive to Ceftriaxone . She will be discharged on Cipro  500mg  xBID for 5 days.    Headache  In the setting of acute illness/poor sleep recently. Continue acetaminophen ,and oxycodone  as needed.   Normocytic anemia Frequently heavy manses. Advised to supplement with iron. Advised to establish with PCP.   Hyponatremia Resolved.          Consultants: None Procedures performed: None  Disposition: Home Diet recommendation:  Regular diet DISCHARGE MEDICATION: Allergies as of 11/12/2024   No Known Allergies      Medication List     STOP taking these medications    cephALEXin  500 MG capsule Commonly known as: KEFLEX        TAKE these medications    ciprofloxacin  500 MG tablet Commonly known as: Cipro  Take 1 tablet (500 mg total) by mouth 2 (two) times daily for 5 days.    cyclobenzaprine  5 MG tablet Commonly known as: FLEXERIL  Take 1 tablet (5 mg total) by mouth 3 (three) times daily as needed for muscle spasms.   HYDROcodone -acetaminophen  7.5-325 MG tablet Commonly known as: NORCO Take 1 tablet by mouth every 6 (six) hours as needed.   phenazopyridine  200 MG tablet Commonly known as: PYRIDIUM  Take 1 tablet (200 mg total) by mouth 3 (three) times daily.        Discharge Exam: Filed Weights   11/09/24 2041  Weight: 63.5 kg   General  No acute Distress Eyes: PERRL, lids and conjunctivae normal ENMT: Mucous membranes are moist.   Neck: normal, supple, no masses, no thyromegaly Respiratory: clear to auscultation bilaterally, no wheezing, no crackles. Normal respiratory effort. No accessory muscle use.  Cardiovascular: Regular rate and rhythm, no murmurs / rubs / gallops Abdomen: Soft, nontender nondistended. Musculoskeletal: no clubbing / cyanosis. No joint deformity upper and lower extremities.  Skin: no rashes, lesions, ulcers. No induration Neurologic: Facial asymmetry, moving extremity spontaneously, speech fluent. Psychiatric: Normal judgment and insight. Alert and oriented x 3. Normal mood.    Condition at discharge: stable  The results of significant diagnostics from this hospitalization (including imaging, microbiology, ancillary and laboratory) are listed below for reference.   Imaging Studies: CT Renal Stone Study Result Date: 11/09/2024 EXAM: CT UROGRAM 11/09/2024 10:31:00 PM TECHNIQUE: CT of the abdomen and pelvis was performed before and after the administration of intravenous contrast as per CT urogram protocol. Multiplanar reformatted images as well as MIP urogram images are provided for review. Automated  exposure control, iterative reconstruction, and/or weight based adjustment of the mA/kV was utilized to reduce the radiation dose to as low as reasonably achievable. COMPARISON: CTs with contrast 10/27/2024 and 09/24/2024.  CLINICAL HISTORY: Right and left lower quadrant abdominal pain radiating to the bilateral flanks, ongoing since early this month, with occasional nausea and vomiting. Fever in triage. FINDINGS: LOWER CHEST: No acute abnormality. LIVER: The liver is 19.5 cm in length, mildly steatotic. No focal abnormality is seen. GALLBLADDER AND BILE DUCTS: Gallbladder is unremarkable. No biliary ductal dilatation. SPLEEN: No acute abnormality. PANCREAS: No acute abnormality. ADRENAL GLANDS: There is no adrenal mass. KIDNEYS, URETERS AND BLADDER: There is no renal mass. No nephrolithiasis, hydronephrosis, or ureteral stones on the current or the prior studies. There is a diffusely thickened bladder with increased perivesical stranding concerning for cystitis. No perinephric or periureteral stranding. GI AND BOWEL: Stomach demonstrates no acute abnormality. There is no bowel obstruction or inflammation. The appendix is normal. PERITONEUM AND RETROPERITONEUM: Small amount of low-density fluid is again present in the pelvic cul-de-sac, which could be reactive or physiologic. There is no free hemorrhage, free air, or localizing encapsulated collections. VASCULATURE: Aorta is normal in caliber. LYMPH NODES: No lymphadenopathy. REPRODUCTIVE ORGANS: No acute abnormality. BONES AND SOFT TISSUES: No acute osseous abnormality. No focal soft tissue abnormality. IMPRESSION: 1. No nephrolithiasis, obstructive uropathy, or renal mass. 2. Bladder wall thickening and perivesical stranding consistent with cystitis. Similar findings on the 2 prior studies. Electronically signed by: Francis Quam MD 11/09/2024 11:58 PM EST RP Workstation: [id number]   DG Chest 2 View Result Date: 11/09/2024 EXAM: 2 VIEW(S) XRAY OF THE CHEST 11/09/2024 09:05:00 PM COMPARISON: None available. CLINICAL HISTORY: cough fever FINDINGS: LUNGS AND PLEURA: No focal pulmonary opacity. No pleural effusion. No pneumothorax. HEART AND MEDIASTINUM: No acute abnormality of the  cardiac and mediastinal silhouettes. BONES AND SOFT TISSUES: No acute osseous abnormality. IMPRESSION: 1. No acute cardiopulmonary abnormality. Electronically signed by: Greig Pique MD 11/09/2024 09:44 PM EST RP Workstation: [id number]   CT ABDOMEN PELVIS W CONTRAST Result Date: 10/27/2024 EXAM: CT ABDOMEN AND PELVIS WITH CONTRAST 10/27/2024 05:22:49 AM TECHNIQUE: CT of the abdomen and pelvis was performed with the administration of 100 mL of iohexol  (OMNIPAQUE ) 300 MG/ML solution. Multiplanar reformatted images are provided for review. Automated exposure control, iterative reconstruction, and/or weight-based adjustment of the mA/kV was utilized to reduce the radiation dose to as low as reasonably achievable. COMPARISON: CT abdomen and pelvis 09/24/2024. Pelvis ultrasound with doppler reported separately today, 10/27/2024. CLINICAL HISTORY: 23 year old female with right lower quadrant pain for 1 week. FINDINGS: LOWER CHEST: Visible lung bases are normal. LIVER: The liver is unremarkable. GALLBLADDER AND BILE DUCTS: Gallbladder is unremarkable. No biliary ductal dilatation. SPLEEN: No acute abnormality. PANCREAS: No acute abnormality. ADRENAL GLANDS: No acute abnormality. KIDNEYS, URETERS AND BLADDER: Renal enhancement is symmetric and within normal limits. No stones in the kidneys or ureters. No hydronephrosis. No perinephric or periureteral stranding. The urinary bladder is decompressed but is diffusely thickened (sagittal image 89 and series 12 image 71) with mucosal hyperenhancement. No gas within the bladder. No additional regional inflammation. GI AND BOWEL: Nondilated stomach and small bowel. Redundant sigmoid colon in the lower abdomen. Mild to moderate large bowel retained gas and stool throughout. Redundant transverse colon. Normal gas containing retrocecal appendix on series 3 image 45. No large bowel inflammation. There is no bowel obstruction. PERITONEUM AND RETROPERITONEUM: No ascites. No free air.  VASCULATURE: Portal venous system, major arterial structures in the  abdomen and pelvis appear patent and normal. Aorta is normal in caliber. LYMPH NODES: No lymphadenopathy. REPRODUCTIVE ORGANS: Incidental tampon in place. Uterus and adnexa appear stable and within normal limits. See also pelvis ultrasound today. BONES AND SOFT TISSUES: Incidental umbilical piercing with mild streak artifact. No acute osseous abnormality. No focal soft tissue abnormality. IMPRESSION: 1. Bulky thickening of the urinary bladder wall with mucosal hyperenhancement. Consider acute UIT and/or cystitis. 2. Normal appendix.  Redundant large bowel with mild to moderate retained stool. Electronically signed by: Helayne Hurst MD 10/27/2024 05:33 AM EST RP Workstation: [id number]   US  PELVIC COMPLETE W TRANSVAGINAL AND TORSION R/O Result Date: 10/27/2024 EXAM: US  Pelvis, Complete Transvaginal and Transabdominal with Doppler TECHNIQUE: Transabdominal and transvaginal pelvic duplex ultrasound using B-mode/gray scaled imaging with Doppler spectral analysis and color flow was obtained. COMPARISON: CT abdomen and pelvis 10/27/2024 reported separately. Pelvis ultrasound 02/25/2024. CLINICAL HISTORY: 23 year old female with 1 week of pelvic pain. FINDINGS: UTERUS: Uterus measures 8.5 x 4.9 x 5.9 cm. Estimated volume 129 ml. Uterus demonstrates normal myometrial echotexture. ENDOMETRIAL STRIPE: Endometrium measures 6 mm and appears normal. RIGHT OVARY: Right ovary measures 3.4 x 2.4 x 2.3 cm. Estimated volume 10 ml. Small but increased follicles compared to 02/25/2024, etiology and significance unclear. Otherwise appears normal. There is normal color and spectral Doppler vascularity. LEFT OVARY: Left ovary measures 2.9 x 2.4 x 3.7 cm. Estimated volume 14 ml. Small but increased follicles compared to 02/25/2024, etiology and significance unclear. Otherwise appears normal. There is normal color and spectral Doppler vascularity. FREE FLUID: Trace simple  appearing free fluid in the cul de sac (series 1 image 25), appears physiologic. IMPRESSION: 1. Negative for ovarian torsion. Normal uterus and endometrium. Electronically signed by: Helayne Hurst MD 10/27/2024 05:28 AM EST RP Workstation: [id number]    Microbiology: Results for orders placed or performed during the hospital encounter of 11/09/24  Urine Culture     Status: Abnormal   Collection Time: 11/09/24  8:43 PM   Specimen: Urine, Clean Catch  Result Value Ref Range Status   Specimen Description   Final    URINE, CLEAN CATCH Performed at [facility], [address] [place], [place] [address]    Special Requests   Final    NONE Performed at [facility], [address] [place], [place] [address]    Culture >=100,000 COLONIES/mL ESCHERICHIA COLI (A)  Final   Report Status 11/12/2024 FINAL  Final   Organism ID, Bacteria ESCHERICHIA COLI (A)  Final      Susceptibility   Escherichia coli - MIC*    AMPICILLIN >=32 RESISTANT Resistant     CEFAZOLIN (URINE) Value in next row Sensitive      4 SENSITIVEThis is a modified FDA-approved test that has been validated and its performance characteristics determined by the reporting laboratory.  This laboratory is certified under the Clinical Laboratory Improvement Amendments CLIA as qualified to perform high complexity clinical laboratory testing.    CEFEPIME Value in next row Sensitive      4 SENSITIVEThis is a modified FDA-approved test that has been validated and its performance characteristics determined by the reporting laboratory.  This laboratory is certified under the Clinical Laboratory Improvement Amendments CLIA as qualified to perform high complexity clinical laboratory testing.    ERTAPENEM Value in next row Sensitive      4 SENSITIVEThis is a modified FDA-approved test that has been validated and its performance characteristics determined by the reporting laboratory.  This  laboratory is certified under the  Clinical Laboratory Improvement Amendments CLIA as qualified to perform high complexity clinical laboratory testing.    CEFTRIAXONE  Value in next row Sensitive      4 SENSITIVEThis is a modified FDA-approved test that has been validated and its performance characteristics determined by the reporting laboratory.  This laboratory is certified under the Clinical Laboratory Improvement Amendments CLIA as qualified to perform high complexity clinical laboratory testing.    CIPROFLOXACIN  Value in next row Sensitive      4 SENSITIVEThis is a modified FDA-approved test that has been validated and its performance characteristics determined by the reporting laboratory.  This laboratory is certified under the Clinical Laboratory Improvement Amendments CLIA as qualified to perform high complexity clinical laboratory testing.    GENTAMICIN Value in next row Sensitive      4 SENSITIVEThis is a modified FDA-approved test that has been validated and its performance characteristics determined by the reporting laboratory.  This laboratory is certified under the Clinical Laboratory Improvement Amendments CLIA as qualified to perform high complexity clinical laboratory testing.    NITROFURANTOIN Value in next row Sensitive      4 SENSITIVEThis is a modified FDA-approved test that has been validated and its performance characteristics determined by the reporting laboratory.  This laboratory is certified under the Clinical Laboratory Improvement Amendments CLIA as qualified to perform high complexity clinical laboratory testing.    TRIMETH/SULFA Value in next row Sensitive      4 SENSITIVEThis is a modified FDA-approved test that has been validated and its performance characteristics determined by the reporting laboratory.  This laboratory is certified under the Clinical Laboratory Improvement Amendments CLIA as qualified to perform high complexity clinical laboratory testing.    AMPICILLIN/SULBACTAM Value in next row  Sensitive      4 SENSITIVEThis is a modified FDA-approved test that has been validated and its performance characteristics determined by the reporting laboratory.  This laboratory is certified under the Clinical Laboratory Improvement Amendments CLIA as qualified to perform high complexity clinical laboratory testing.    PIP/TAZO Value in next row Sensitive      <=4 SENSITIVEThis is a modified FDA-approved test that has been validated and its performance characteristics determined by the reporting laboratory.  This laboratory is certified under the Clinical Laboratory Improvement Amendments CLIA as qualified to perform high complexity clinical laboratory testing.    MEROPENEM Value in next row Sensitive      <=4 SENSITIVEThis is a modified FDA-approved test that has been validated and its performance characteristics determined by the reporting laboratory.  This laboratory is certified under the Clinical Laboratory Improvement Amendments CLIA as qualified to perform high complexity clinical laboratory testing.    * >=100,000 COLONIES/mL ESCHERICHIA COLI  Resp panel by RT-PCR (RSV, Flu A&B, Covid) Anterior Nasal Swab     Status: None   Collection Time: 11/09/24  8:44 PM   Specimen: Anterior Nasal Swab  Result Value Ref Range Status   SARS Coronavirus 2 by RT PCR NEGATIVE NEGATIVE Final    Comment: (NOTE) SARS-CoV-2 target nucleic acids are NOT DETECTED.  The SARS-CoV-2 RNA is generally detectable in upper respiratory specimens during the acute phase of infection. The lowest concentration of SARS-CoV-2 viral copies this assay can detect is 138 copies/mL. A negative result does not preclude SARS-Cov-2 infection and should not be used as the sole basis for treatment or other patient management decisions. A negative result may occur with  improper specimen collection/handling, submission of specimen other than  nasopharyngeal swab, presence of viral mutation(s) within the areas targeted by this  assay, and inadequate number of viral copies(<138 copies/mL). A negative result must be combined with clinical observations, patient history, and epidemiological information. The expected result is Negative.  Fact Sheet for Patients:  bloggercourse.com  Fact Sheet for Healthcare Providers:  seriousbroker.it  This test is no t yet approved or cleared by the [place]  FDA and  has been authorized for detection and/or diagnosis of SARS-CoV-2 by FDA under an Emergency Use Authorization (EUA). This EUA will remain  in effect (meaning this test can be used) for the duration of the COVID-19 declaration under Section 564(b)(1) of the Act, 21 U.S.C.section 360bbb-3(b)(1), unless the authorization is terminated  or revoked sooner.       Influenza A by PCR NEGATIVE NEGATIVE Final   Influenza B by PCR NEGATIVE NEGATIVE Final    Comment: (NOTE) The Xpert Xpress SARS-CoV-2/FLU/RSV plus assay is intended as an aid in the diagnosis of influenza from Nasopharyngeal swab specimens and should not be used as a sole basis for treatment. Nasal washings and aspirates are unacceptable for Xpert Xpress SARS-CoV-2/FLU/RSV testing.  Fact Sheet for Patients: bloggercourse.com  Fact Sheet for Healthcare Providers: seriousbroker.it  This test is not yet approved or cleared by the [place]  FDA and has been authorized for detection and/or diagnosis of SARS-CoV-2 by FDA under an Emergency Use Authorization (EUA). This EUA will remain in effect (meaning this test can be used) for the duration of the COVID-19 declaration under Section 564(b)(1) of the Act, 21 U.S.C. section 360bbb-3(b)(1), unless the authorization is terminated or revoked.     Resp Syncytial Virus by PCR NEGATIVE NEGATIVE Final    Comment: (NOTE) Fact Sheet for Patients: bloggercourse.com  Fact Sheet for  Healthcare Providers: seriousbroker.it  This test is not yet approved or cleared by the [place]  FDA and has been authorized for detection and/or diagnosis of SARS-CoV-2 by FDA under an Emergency Use Authorization (EUA). This EUA will remain in effect (meaning this test can be used) for the duration of the COVID-19 declaration under Section 564(b)(1) of the Act, 21 U.S.C. section 360bbb-3(b)(1), unless the authorization is terminated or revoked.  Performed at [facility], [address] [place], [place] [address]   Culture, blood (Routine x 2)     Status: None (Preliminary result)   Collection Time: 11/09/24  9:21 PM   Specimen: BLOOD  Result Value Ref Range Status   Specimen Description   Final    BLOOD LEFT ANTECUBITAL Performed at [facility], [address] [place], [place] [address]    Special Requests   Final    BOTTLES DRAWN AEROBIC AND ANAEROBIC Blood Culture adequate volume Performed at [facility], [address] [place], [place] [address]    Culture   Final    NO GROWTH 2 DAYS Performed at [facility], [address] [place], [place] [address]    Report Status PENDING  Incomplete  Culture, blood (Routine x 2)     Status: None (Preliminary result)   Collection Time: 11/09/24  9:26 PM   Specimen: BLOOD RIGHT HAND  Result Value Ref Range Status   Specimen Description   Final    BLOOD RIGHT HAND Performed at [facility], [address] [place], [place] [address]    Special Requests   Final    BOTTLES DRAWN AEROBIC AND ANAEROBIC Blood Culture adequate volume  Performed at [facility], [address] [place], [place] [address]    Culture   Final    NO GROWTH 2 DAYS Performed at [facility], [address] [place], [place] [address]    Report Status PENDING  Incomplete    Labs: CBC: Recent Labs  Lab 11/09/24 2043 11/10/24 0858 11/12/24 1027  WBC  8.9 9.3 4.5  HGB 11.7* 11.6* 11.0*  HCT 34.3* 34.2* 32.2*  MCV 87.3 89.8 88.7  PLT 306 300 314   Basic Metabolic Panel: Recent Labs  Lab 11/09/24 2043 11/10/24 0858 11/12/24 1027  NA 134* 136 139  K 3.6 3.6 4.0  CL 99 100 103  CO2 21* 25 24  GLUCOSE 113* 92 87  BUN <5* 6 <5*  CREATININE 0.69 0.81 0.60  CALCIUM 9.1 9.3 9.8  MG  --  2.0  --    Liver Function Tests: Recent Labs  Lab 11/09/24 2043 11/12/24 1027  AST 19 36  ALT 10 34  ALKPHOS 72 88  BILITOT 0.3 0.2  PROT 7.1 7.1  ALBUMIN 4.1 3.8   CBG: No results for input(s): GLUCAP in the last 168 hours.  Discharge time spent: greater than 30 minutes.  Signed: Landon FORBES Baller, MD [facility] 11/12/2024 "

## 2024-11-12 NOTE — TOC Initial Note (Signed)
 Transition of Care [facility]) - Initial/Assessment Note    Patient Details  Name: Tricia Knox MRN: [id number] Date of Birth: 10-12-2001  [facility]) CM/SW Contact:    Doneta Glenys DASEN, RN Phone Number: 11/12/2024, 4:24 PM  Clinical Narrative:                 No PCP-has a PCP appointment scheduled per patient. No additional needs identified. IP CM signing off  Expected Discharge Plan: Home/Self Care Barriers to Discharge: Other (must enter comment) (No PCP)   Patient Goals and CMS Choice Patient states their goals for this hospitalization and ongoing recovery are:: Home CMS Medicare.gov Compare Post Acute Care list provided to:: Patient Choice offered to / list presented to : Patient [place] ownership interest in [facility].provided to:: Patient    Expected Discharge Plan and Services In-house Referral: NA Discharge Planning Services: CM Consult   Living arrangements for the past 2 months: Apartment                 DME Arranged: N/A DME Agency: NA       HH Arranged: NA HH Agency: NA        Prior Living Arrangements/Services Living arrangements for the past 2 months: Apartment Lives with:: Self Patient language and need for interpreter reviewed:: Yes Do you feel safe going back to the place where you live?: Yes      Need for Family Participation in Patient Care: No (Comment) Care giver support system in place?: Yes (comment) Current home services:  (NA) Criminal Activity/Legal Involvement Pertinent to Current Situation/Hospitalization: No - Comment as needed  Activities of Daily Living   ADL Screening (condition at time of admission) Independently performs ADLs?: Yes (appropriate for developmental age) Is the patient deaf or have difficulty hearing?: No Does the patient have difficulty seeing, even when wearing glasses/contacts?: No Does the patient have difficulty concentrating, remembering, or making decisions?: No  Permission  Sought/Granted Permission sought to share information with : Case Manager Permission granted to share information with : Yes, Verbal Permission Granted  Share Information with NAME: Tricia, Knox  Mother, Emergency Contact  [phone]           Emotional Assessment Appearance:: Appears stated age Attitude/Demeanor/Rapport: Engaged Affect (typically observed): Appropriate Orientation: : Oriented to Self, Oriented to Place, Oriented to  Time, Oriented to Situation Alcohol / Substance Use: Not Applicable Psych Involvement: No (comment)  Admission diagnosis:  Acute cystitis [N30.00] Pyelonephritis [N12] Patient Active Problem List   Diagnosis Date Noted   Acute cystitis 11/10/2024   Normocytic anemia 11/10/2024   Hyponatremia 11/10/2024   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   Right flank pain 11/10/2024   Headache 11/10/2024   Abdominal pain 10/22/2024   Ruptured cyst of ovary 10/20/2024   Foreign body of right middle ear 04/15/2016   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   Left ear impacted cerumen 04/15/2016   PCP:  Patient, No Pcp Per Pharmacy:   [id number] - [place], [place] - [address] AT [facility] [address] [place] [address] Phone: [phone] Fax: [phone]  [id number] [place], [place] - [address] AT [facility] [address] & [facility] [address]  [place] [address] Phone: [phone] Fax: [phone]  [id number] - , [place] - [address] AT [facility] [address] [place] [address] Phone: [phone]  Fax: [phone]  [id number] [place], [place] - [address] [place] [address] Phone: [phone] Fax: [phone]     Social Drivers of Health (SDOH) Social History: SDOH Screenings   Food Insecurity: No Food Insecurity (11/10/2024)  Housing: Low  Risk (11/11/2024)  Transportation Needs: No Transportation Needs (11/10/2024)  Utilities: Not At Risk (11/10/2024)  Social Connections: Unknown (11/10/2024)  Tobacco Use: Low Risk (11/10/2024)   SDOH Interventions:     Readmission Risk Interventions     No data to display

## 2024-11-15 LAB — CULTURE, BLOOD (ROUTINE X 2)
Culture: NO GROWTH
Culture: NO GROWTH
Special Requests: ADEQUATE
Special Requests: ADEQUATE
